# Patient Record
Sex: Male | Born: 1960
Health system: Southern US, Community
[De-identification: ages and names within clinical notes are randomized; demographics above are authoritative.]

## PROBLEM LIST (undated history)

## (undated) DIAGNOSIS — E119 Type 2 diabetes mellitus without complications: Secondary | ICD-10-CM

## (undated) DIAGNOSIS — E785 Hyperlipidemia, unspecified: Secondary | ICD-10-CM

## (undated) DIAGNOSIS — I10 Essential (primary) hypertension: Secondary | ICD-10-CM

## (undated) DIAGNOSIS — M1990 Unspecified osteoarthritis, unspecified site: Secondary | ICD-10-CM

## (undated) HISTORY — DX: Essential (primary) hypertension: I10

## (undated) HISTORY — DX: Hyperlipidemia, unspecified: E78.5

## (undated) HISTORY — DX: Unspecified osteoarthritis, unspecified site: M19.90

---

## 1999-08-16 ENCOUNTER — Encounter: Payer: Self-pay | Admitting: Emergency Medicine

## 1999-08-16 ENCOUNTER — Emergency Department (HOSPITAL_COMMUNITY): Admission: EM | Admit: 1999-08-16 | Discharge: 1999-08-16 | Payer: Self-pay | Admitting: Emergency Medicine

## 2000-10-27 ENCOUNTER — Encounter: Payer: Self-pay | Admitting: Emergency Medicine

## 2000-10-27 ENCOUNTER — Emergency Department (HOSPITAL_COMMUNITY): Admission: EM | Admit: 2000-10-27 | Discharge: 2000-10-27 | Payer: Self-pay | Admitting: Emergency Medicine

## 2004-07-14 ENCOUNTER — Emergency Department (HOSPITAL_COMMUNITY): Admission: EM | Admit: 2004-07-14 | Discharge: 2004-07-14 | Payer: Self-pay | Admitting: Emergency Medicine

## 2009-08-28 ENCOUNTER — Emergency Department (HOSPITAL_COMMUNITY): Admission: EM | Admit: 2009-08-28 | Discharge: 2009-08-28 | Payer: Self-pay | Admitting: Family Medicine

## 2019-01-13 ENCOUNTER — Other Ambulatory Visit: Payer: Self-pay

## 2019-01-13 ENCOUNTER — Encounter (HOSPITAL_COMMUNITY): Payer: Self-pay

## 2019-01-13 ENCOUNTER — Ambulatory Visit (HOSPITAL_COMMUNITY)
Admission: EM | Admit: 2019-01-13 | Discharge: 2019-01-13 | Disposition: A | Discharge status: Home / Self Care | Payer: Self-pay

## 2019-01-13 DIAGNOSIS — I10 Essential (primary) hypertension: Secondary | ICD-10-CM

## 2019-01-13 DIAGNOSIS — G8929 Other chronic pain: Secondary | ICD-10-CM

## 2019-01-13 DIAGNOSIS — M25552 Pain in left hip: Secondary | ICD-10-CM

## 2019-01-13 DIAGNOSIS — M25532 Pain in left wrist: Secondary | ICD-10-CM

## 2019-01-13 DIAGNOSIS — M25562 Pain in left knee: Secondary | ICD-10-CM

## 2019-01-13 HISTORY — DX: Type 2 diabetes mellitus without complications: E11.9

## 2019-01-13 MED ORDER — NAPROXEN 500 MG PO TABS: 500.0000 mg | ORAL_TABLET | Freq: Two times a day (BID) | ORAL | 0 refills | Status: DC

## 2019-01-13 NOTE — Discharge Instructions (Addendum)
Take the naproxen as needed for your joint pain.  Follow-up with an orthopedist as needed for this ongoing chronic discomfort.    Your blood pressure is elevated today at 165/90.  Please have this rechecked by your primary care provider in 2 weeks.  If you do not have a primary care provider, one is suggested below.

## 2019-01-13 NOTE — ED Triage Notes (Addendum)
Patient reports having right wrist pain, hip pain and numbness and tingling sensation on left leg for 1 year, is worse when hi walks or stand up for long periods of  time.

## 2019-01-13 NOTE — ED Provider Notes (Signed)
[facility]    CSN: [id number] Arrival date & time: 01/13/19  1125      History   Chief Complaint Chief Complaint  Patient presents with  . Hip Pain  . Leg Pain  . Wrist Pain    HPI Carlos Charles is a 58 y.o. male.   Patient presents with pain in his left wrist, left hip, and left knee for >1 year.  He denies falls or injury.  He reports intermittent numbness and weakness in his left leg.  He denies saddle anesthesia, loss of bowel/bladder control.  His pain is worse with walking and standing and improves with rest.  He states he was treated with Percocet and then Neurontin in the past.  He has attempted treatment recently with Tylenol, ibuprofen, and Aleve without relief.  He also states he was on blood pressure medication but has stopped taking those in the last few months.  He states he does not have a PCP.  The history is provided by the patient.    Past Medical History:  Diagnosis Date  . Diabetes mellitus without complication (HCC)     There are no active problems to display for this patient.   History reviewed. No pertinent surgical history.     Home Medications    Prior to Admission medications   Medication Sig Start Date End Date Taking? Authorizing Provider  metFORMIN (GLUCOPHAGE) 850 MG tablet Take 850 mg by mouth 2 (two) times daily with a meal.   Yes [provider]  naproxen (NAPROSYN) 500 MG tablet Take 1 tablet (500 mg total) by mouth 2 (two) times daily. 01/13/19   Mickie Bailate, Hadas Jessop H, NP    Family History Family History  Problem Relation Age of Onset  . Diabetes Mother   . Healthy Father   . Diabetes Sister     Social History Social History   Tobacco Use  . Smoking status: Current Every Day Smoker  . Smokeless tobacco: Never Used  Substance Use Topics  . Alcohol use: Yes  . Drug use: Not on file     Allergies   Patient has no allergy information on record.   Review of Systems Review of Systems  Constitutional:  Negative for chills and fever.  HENT: Negative for ear pain and sore throat.   Eyes: Negative for pain and visual disturbance.  Respiratory: Negative for cough and shortness of breath.   Cardiovascular: Negative for chest pain and palpitations.  Gastrointestinal: Negative for abdominal pain and vomiting.  Genitourinary: Negative for dysuria and hematuria.  Musculoskeletal: Positive for arthralgias. Negative for back pain.  Skin: Negative for color change and rash.  Neurological: Positive for weakness. Negative for seizures, syncope and numbness.  All other systems reviewed and are negative.    Physical Exam Triage Vital Signs ED Triage Vitals  Enc Vitals Group     BP 01/13/19 1149 (!) 165/90     Pulse Rate 01/13/19 1149 100     Resp 01/13/19 1149 18     Temp 01/13/19 1149 98.4 F (36.9 C)     Temp Source 01/13/19 1149 Oral     SpO2 01/13/19 1149 97 %     Weight --      Height --      Head Circumference --      Peak Flow --      Pain Score 01/13/19 1143 10     Pain Loc --      Pain Edu? --  Excl. in GC? --    No data found.  Updated Vital Signs BP (!) 165/90 (BP Location: Right Arm)   Pulse 100   Temp 98.4 F (36.9 C) (Oral)   Resp 18   SpO2 97%   Visual Acuity Right Eye Distance:   Left Eye Distance:   Bilateral Distance:    Right Eye Near:   Left Eye Near:    Bilateral Near:     Physical Exam Vitals signs and nursing note reviewed.  Constitutional:      Appearance: He is well-developed.  HENT:     Head: Normocephalic and atraumatic.     Mouth/Throat:     Mouth: Mucous membranes are moist.  Eyes:     Conjunctiva/sclera: Conjunctivae normal.  Neck:     Musculoskeletal: Neck supple.  Cardiovascular:     Rate and Rhythm: Normal rate and regular rhythm.     Heart sounds: No murmur.  Pulmonary:     Effort: Pulmonary effort is normal. No respiratory distress.     Breath sounds: Normal breath sounds.  Abdominal:     Palpations: Abdomen is soft.      Tenderness: There is no abdominal tenderness.  Musculoskeletal: Normal range of motion.        General: Tenderness present. No swelling, deformity or signs of injury.     Right lower leg: No edema.     Left lower leg: No edema.     Comments: Generalized tenderness to palpation of left wrist, left knee, and left hip.   Skin:    General: Skin is warm and dry.     Capillary Refill: Capillary refill takes less than 2 seconds.     Findings: No bruising, erythema, lesion or rash.  Neurological:     General: No focal deficit present.     Mental Status: He is alert and oriented to person, place, and time.     Sensory: No sensory deficit.     Motor: No weakness.     Coordination: Coordination normal.     Gait: Gait normal.     Deep Tendon Reflexes: Reflexes normal.      UC Treatments / Results  Labs (all labs ordered are listed, but only abnormal results are displayed) Labs Reviewed - No data to display  EKG   Radiology No results found.  Procedures Procedures (including critical care time)  Medications Ordered in UC Medications - No data to display  Initial Impression / Assessment and Plan / UC Course  I have reviewed the triage vital signs and the nursing notes.  Pertinent labs & imaging results that were available during my care of the patient were reviewed by me and considered in my medical decision making (see chart for details).    Left wrist pain, left hip pain, left knee pain, elevated blood pressure with known diagnosis of hypertension.  Treating ongoing chronic joint discomfort with naproxen.  Discussed with patient that he can follow-up with orthopedics for this ongoing chronic joint pain.  Discussed with patient that his blood pressure is elevated and he needs to have this rechecked by his PCP or the PCP suggested in the next 2 to 4 weeks.  Patient agrees to plan of care.     Final Clinical Impressions(s) / UC Diagnoses   Final diagnoses:  Left wrist pain  Pain  of left hip joint  Chronic pain of left knee  Elevated blood pressure reading in office with diagnosis of hypertension     Discharge Instructions  Take the naproxen as needed for your joint pain.  Follow-up with an orthopedist as needed for this ongoing chronic discomfort.    Your blood pressure is elevated today at 165/90.  Please have this rechecked by your primary care provider in 2 weeks.  If you do not have a primary care provider, one is suggested below.         ED Prescriptions    Medication Sig Dispense Auth. Provider   naproxen (NAPROSYN) 500 MG tablet Take 1 tablet (500 mg total) by mouth 2 (two) times daily. 30 tablet Mickie Bail, NP     I have reviewed the PDMP during this encounter.   Mickie Bail, NP 01/13/19 1235

## 2019-03-01 ENCOUNTER — Other Ambulatory Visit: Payer: Self-pay

## 2019-03-01 ENCOUNTER — Encounter (HOSPITAL_COMMUNITY): Payer: Self-pay

## 2019-03-01 ENCOUNTER — Ambulatory Visit (HOSPITAL_COMMUNITY)
Admission: EM | Admit: 2019-03-01 | Discharge: 2019-03-01 | Disposition: A | Discharge status: Home / Self Care | Payer: Self-pay | Attending: Urgent Care | Admitting: Urgent Care

## 2019-03-01 DIAGNOSIS — Z9114 Patient's other noncompliance with medication regimen: Secondary | ICD-10-CM

## 2019-03-01 DIAGNOSIS — Z789 Other specified health status: Secondary | ICD-10-CM

## 2019-03-01 DIAGNOSIS — I10 Essential (primary) hypertension: Secondary | ICD-10-CM | POA: Insufficient documentation

## 2019-03-01 DIAGNOSIS — F17200 Nicotine dependence, unspecified, uncomplicated: Secondary | ICD-10-CM | POA: Insufficient documentation

## 2019-03-01 DIAGNOSIS — M25532 Pain in left wrist: Secondary | ICD-10-CM | POA: Insufficient documentation

## 2019-03-01 DIAGNOSIS — Z7289 Other problems related to lifestyle: Secondary | ICD-10-CM | POA: Insufficient documentation

## 2019-03-01 DIAGNOSIS — M25432 Effusion, left wrist: Secondary | ICD-10-CM | POA: Insufficient documentation

## 2019-03-01 DIAGNOSIS — E1165 Type 2 diabetes mellitus with hyperglycemia: Secondary | ICD-10-CM | POA: Insufficient documentation

## 2019-03-01 LAB — POCT URINALYSIS DIP (DEVICE)
Bilirubin Urine: NEGATIVE
Glucose, UA: >=1000 mg/dL — AB
Ketones, ur: 15 mg/dL — AB
Nitrite: NEGATIVE
Protein, ur: 100 mg/dL — AB
Specific Gravity, Urine: >=1.03 (ref 1.005–1.030)
Urobilinogen, UA: 0.2 mg/dL (ref 0.0–1.0)
pH: 5 (ref 5.0–8.0)

## 2019-03-01 LAB — CBC
HCT: 39.8 % (ref 39.0–52.0)
Hemoglobin: 13.4 g/dL (ref 13.0–17.0)
MCH: 31.5 pg (ref 26.0–34.0)
MCHC: 33.7 g/dL (ref 30.0–36.0)
MCV: 93.6 fL (ref 80.0–100.0)
Platelets: 231 10*3/uL (ref 150–400)
RBC: 4.25 MIL/uL (ref 4.22–5.81)
RDW: 12.6 % (ref 11.5–15.5)
WBC: 8 10*3/uL (ref 4.0–10.5)
nRBC: 0 % (ref 0.0–0.2)

## 2019-03-01 LAB — GLUCOSE, CAPILLARY: Glucose-Capillary: 110 mg/dL — ABNORMAL HIGH (ref 70–99)

## 2019-03-01 MED ORDER — LISINOPRIL 10 MG PO TABS: 10.0000 mg | ORAL_TABLET | Freq: Every day | ORAL | 0 refills | Status: DC

## 2019-03-01 MED ORDER — PREDNISONE 20 MG PO TABS: ORAL_TABLET | ORAL | 0 refills | Status: DC

## 2019-03-01 NOTE — ED Provider Notes (Signed)
MRN: [id number] DOB: [birth year]  Subjective:   Carlos Charles is a 58 y.o. male presenting for 1 day history of acute onset of left sided wrist pain, swelling that is worsening and severe.  Has tried [employer] consistently with temporary relief only.  Denies falls, trauma, open wound.  Denies history of gout.  Patient is on Lantus, takes 16 units nightly. Has not been taking this. He also has a hx of HTN, has not taken his bp medication since running out a month ago. Per patient has a hx of left hip arthritis and was told he needs a hip replacement. Currently does not have insurance, no PCP.   Smokes 1/2-1 pack per day, drinks 1/5 of liquor about every weekend.  No current facility-administered medications for this encounter.   Current Outpatient Medications:  .  metFORMIN (GLUCOPHAGE) 850 MG tablet, Take 850 mg by mouth 2 (two) times daily with a meal., Disp: , Rfl:  .  naproxen (NAPROSYN) 500 MG tablet, Take 1 tablet (500 mg total) by mouth 2 (two) times daily., Disp: 30 tablet, Rfl: 0    No Known Allergies   Past Medical History:  Diagnosis Date  . Diabetes mellitus without complication ([place])      Denies psh.    Family History  Problem Relation Age of Onset  . Diabetes Mother   . Healthy Father   . Diabetes Sister     Social History   Tobacco Use  . Smoking status: Current Every Day Smoker  . Smokeless tobacco: Never Used  Substance Use Topics  . Alcohol use: Yes  . Drug use: Not on file    ROS   Objective:   Vitals: BP (!) 186/97 (BP Location: Right Arm)   Pulse (!) 104   Temp 97.9 F (36.6 C) (Temporal)   Resp 16   Wt 173 lb (78.5 kg)   SpO2 99%   BP 174/95, Pulse 93 on recheck by PA-Dawnyel Leven at 13:52, cuff on left arm in seated position.   Physical Exam Constitutional:      General: He is not in acute distress.    Appearance: Normal appearance. He is well-developed. He is not ill-appearing, toxic-appearing or diaphoretic.  HENT:     Head:  Normocephalic and atraumatic.     Right Ear: External ear normal.     Left Ear: External ear normal.     Nose: Nose normal.     Mouth/Throat:     Mouth: Mucous membranes are moist.     Pharynx: Oropharynx is clear.  Eyes:     General: No scleral icterus.    Extraocular Movements: Extraocular movements intact.     Pupils: Pupils are equal, round, and reactive to light.  Cardiovascular:     Rate and Rhythm: Normal rate and regular rhythm.     Heart sounds: Normal heart sounds. No murmur. No friction rub. No gallop.   Pulmonary:     Effort: Pulmonary effort is normal. No respiratory distress.     Breath sounds: Normal breath sounds. No stridor. No wheezing, rhonchi or rales.  Musculoskeletal:     Left wrist: He exhibits decreased range of motion, tenderness (Exquisite tenderness worse over radial aspect even with lightest superficial palpation) and swelling (2+ over radial aspect, tapers off laterally toward ulnar side; there is associated warmth and mild erythema). He exhibits no deformity and no laceration.  Skin:    General: Skin is warm and dry.  Neurological:     Mental Status: He is  alert and oriented to person, place, and time.     Cranial Nerves: No cranial nerve deficit.     Motor: No weakness.     Coordination: Coordination normal.     Gait: Gait normal.     Deep Tendon Reflexes: Reflexes normal.  Psychiatric:        Mood and Affect: Mood normal.        Behavior: Behavior normal.        Thought Content: Thought content normal.        Judgment: Judgment normal.    Results for orders placed or performed during the hospital encounter of 03/01/19 (from the past 24 hour(s))  Glucose, capillary     Status: Abnormal   Collection Time: 03/01/19  2:07 PM  Result Value Ref Range   Glucose-Capillary 110 (H) 70 - 99 mg/dL  POCT urinalysis dip (device)     Status: Abnormal   Collection Time: 03/01/19  2:10 PM  Result Value Ref Range   Glucose, UA >=1000 (A) NEGATIVE mg/dL    Bilirubin Urine NEGATIVE NEGATIVE   Ketones, ur 15 (A) NEGATIVE mg/dL   Specific Gravity, Urine >=1.030 1.005 - 1.030   Hgb urine dipstick TRACE (A) NEGATIVE   pH 5.0 5.0 - 8.0   Protein, ur 100 (A) NEGATIVE mg/dL   Urobilinogen, UA 0.2 0.0 - 1.0 mg/dL   Nitrite NEGATIVE NEGATIVE   Leukocytes,Ua TRACE (A) NEGATIVE    Assessment and Plan :   1. Acute pain of left wrist   2. Pain and swelling of left wrist   3. Essential hypertension   4. Elevated blood pressure reading with diagnosis of hypertension   5. Uncontrolled type 2 diabetes mellitus with hyperglycemia (HCC)   6. Tobacco use disorder   7. Alcohol use     Emphasized need to manage chronic conditions a lot better.  Will use a prednisone course for inflammatory process such as gout.  Patient is to stop drinking alcohol as best he can.  Hydrate much better.  Refill of his blood pressure medication lisinopril.  Recommended lifestyle and dietary modification.  Patient is contact [facility] to get help finding a PCP. Counseled patient on potential for adverse effects with medications prescribed/recommended today, ER and return-to-clinic precautions discussed, patient verbalized understanding.    Wallis Bamberg, PA-C 03/01/19 1443

## 2019-03-01 NOTE — ED Triage Notes (Signed)
Pt states he has pain in his left wrist x 2 days. Pt states it swollen and aching and throbbing. Pt states he wake up to this pain and swelling.

## 2019-03-01 NOTE — Discharge Instructions (Signed)
Please cut back on alcohol drinking as much as you can. You need to hydrate with at least 2 liters of water daily, that's 64 ounces.    For elevated blood pressure, make sure you are monitoring salt in your diet.  Do not eat restaurant foods and limit processed foods at home.  Processed foods include things like frozen meals preseason meats and dinners.  Make sure your pain attention to sodium labels on foods you by at the grocery store.  For seasoning you can use a brand called Mrs[employer] Dash which includes a lot of salt free seasonings.  Salads - kale, spinach, cabbage, spring mix; use seeds like pumpkin seeds or sunflower seeds, almonds; you can also use 1-2 hard boiled eggs in your salads Fruits - avocadoes, berries (blueberries, raspberries, blackberries), apples, oranges Vegetables - aspargus, cauliflower, broccoli, green beans, brussel spouts, bell peppers; stay away from starchy vegetables like potatoes, carrots, peas  Regarding meat it is better to eat lean meats and limit your red meat consumption including pork.  Wild caught fish, chicken breast are good options.  Do not eat any foods on this list that you are allergic to.

## 2019-03-09 ENCOUNTER — Inpatient Hospital Stay (HOSPITAL_COMMUNITY)
Admission: EM | Admit: 2019-03-09 | Discharge: 2019-03-13 | DRG: 982 | Disposition: A | Discharge status: Home / Self Care | Payer: Self-pay | Attending: Internal Medicine | Admitting: Internal Medicine

## 2019-03-09 ENCOUNTER — Encounter: Payer: Self-pay | Admitting: Internal Medicine

## 2019-03-09 ENCOUNTER — Encounter (HOSPITAL_COMMUNITY): Payer: Self-pay | Admitting: *Deleted

## 2019-03-09 ENCOUNTER — Encounter (HOSPITAL_COMMUNITY)
Admission: EM | Disposition: A | Discharge status: Home / Self Care | Payer: Self-pay | Source: Home / Self Care | Attending: Internal Medicine

## 2019-03-09 ENCOUNTER — Other Ambulatory Visit: Payer: Self-pay

## 2019-03-09 ENCOUNTER — Emergency Department (HOSPITAL_COMMUNITY): Payer: Self-pay

## 2019-03-09 ENCOUNTER — Inpatient Hospital Stay (HOSPITAL_COMMUNITY): Payer: Self-pay | Admitting: Certified Registered"

## 2019-03-09 ENCOUNTER — Encounter (HOSPITAL_COMMUNITY): Payer: Self-pay | Admitting: Emergency Medicine

## 2019-03-09 ENCOUNTER — Ambulatory Visit: Payer: Self-pay | Admitting: Internal Medicine

## 2019-03-09 VITALS — BP 165/101 | HR 83 | Temp 98.1°F | Ht 71.0 in | Wt 173.0 lb

## 2019-03-09 DIAGNOSIS — I10 Essential (primary) hypertension: Secondary | ICD-10-CM | POA: Diagnosis present

## 2019-03-09 DIAGNOSIS — Z91120 Patient's intentional underdosing of medication regimen due to financial hardship: Secondary | ICD-10-CM

## 2019-03-09 DIAGNOSIS — L03114 Cellulitis of left upper limb: Secondary | ICD-10-CM | POA: Diagnosis present

## 2019-03-09 DIAGNOSIS — Z7984 Long term (current) use of oral hypoglycemic drugs: Secondary | ICD-10-CM

## 2019-03-09 DIAGNOSIS — Z79899 Other long term (current) drug therapy: Secondary | ICD-10-CM

## 2019-03-09 DIAGNOSIS — E119 Type 2 diabetes mellitus without complications: Secondary | ICD-10-CM

## 2019-03-09 DIAGNOSIS — Z794 Long term (current) use of insulin: Secondary | ICD-10-CM

## 2019-03-09 DIAGNOSIS — E118 Type 2 diabetes mellitus with unspecified complications: Secondary | ICD-10-CM

## 2019-03-09 DIAGNOSIS — M65142 Other infective (teno)synovitis, left hand: Secondary | ICD-10-CM | POA: Diagnosis present

## 2019-03-09 DIAGNOSIS — T383X6A Underdosing of insulin and oral hypoglycemic [antidiabetic] drugs, initial encounter: Secondary | ICD-10-CM | POA: Diagnosis present

## 2019-03-09 DIAGNOSIS — E785 Hyperlipidemia, unspecified: Secondary | ICD-10-CM | POA: Diagnosis present

## 2019-03-09 DIAGNOSIS — Z20828 Contact with and (suspected) exposure to other viral communicable diseases: Secondary | ICD-10-CM | POA: Diagnosis present

## 2019-03-09 DIAGNOSIS — E1165 Type 2 diabetes mellitus with hyperglycemia: Secondary | ICD-10-CM | POA: Diagnosis present

## 2019-03-09 DIAGNOSIS — M160 Bilateral primary osteoarthritis of hip: Secondary | ICD-10-CM | POA: Diagnosis present

## 2019-03-09 DIAGNOSIS — F17210 Nicotine dependence, cigarettes, uncomplicated: Secondary | ICD-10-CM

## 2019-03-09 DIAGNOSIS — Z833 Family history of diabetes mellitus: Secondary | ICD-10-CM

## 2019-03-09 DIAGNOSIS — M7989 Other specified soft tissue disorders: Secondary | ICD-10-CM

## 2019-03-09 DIAGNOSIS — M170 Bilateral primary osteoarthritis of knee: Secondary | ICD-10-CM | POA: Diagnosis present

## 2019-03-09 DIAGNOSIS — Z7689 Persons encountering health services in other specified circumstances: Secondary | ICD-10-CM

## 2019-03-09 DIAGNOSIS — L02512 Cutaneous abscess of left hand: Principal | ICD-10-CM | POA: Diagnosis present

## 2019-03-09 DIAGNOSIS — M659 Synovitis and tenosynovitis, unspecified: Secondary | ICD-10-CM | POA: Diagnosis present

## 2019-03-09 DIAGNOSIS — M65942 Unspecified synovitis and tenosynovitis, left hand: Secondary | ICD-10-CM | POA: Diagnosis present

## 2019-03-09 HISTORY — PX: I & D EXTREMITY: SHX5045

## 2019-03-09 LAB — BASIC METABOLIC PANEL
Anion gap: 11 (ref 5–15)
BUN: 10 mg/dL (ref 6–20)
CO2: 24 mmol/L (ref 22–32)
Calcium: 9.1 mg/dL (ref 8.9–10.3)
Chloride: 101 mmol/L (ref 98–111)
Creatinine, Ser: 0.7 mg/dL (ref 0.61–1.24)
GFR calc Af Amer: >60 mL/min (ref >60)
GFR calc non Af Amer: >60 mL/min (ref >60)
Glucose, Bld: 199 mg/dL — ABNORMAL HIGH (ref 70–99)
Potassium: 4 mmol/L (ref 3.5–5.1)
Sodium: 136 mmol/L (ref 135–145)

## 2019-03-09 LAB — SEDIMENTATION RATE: Sed Rate: 77 mm/h — ABNORMAL HIGH (ref 0–16)

## 2019-03-09 LAB — CBC WITH DIFFERENTIAL/PLATELET
Abs Immature Granulocytes: 0.03 10*3/uL (ref 0.00–0.07)
Basophils Absolute: 0 10*3/uL (ref 0.0–0.1)
Basophils Relative: 0 %
Eosinophils Absolute: 0.1 10*3/uL (ref 0.0–0.5)
Eosinophils Relative: 1 %
HCT: 41.6 % (ref 39.0–52.0)
Hemoglobin: 13.6 g/dL (ref 13.0–17.0)
Immature Granulocytes: 0 %
Lymphocytes Relative: 26 %
Lymphs Abs: 2.1 10*3/uL (ref 0.7–4.0)
MCH: 31.9 pg (ref 26.0–34.0)
MCHC: 32.7 g/dL (ref 30.0–36.0)
MCV: 97.4 fL (ref 80.0–100.0)
Monocytes Absolute: 0.8 10*3/uL (ref 0.1–1.0)
Monocytes Relative: 10 %
Neutro Abs: 5.1 10*3/uL (ref 1.7–7.7)
Neutrophils Relative %: 63 %
Platelets: 315 10*3/uL (ref 150–400)
RBC: 4.27 MIL/uL (ref 4.22–5.81)
RDW: 12.7 % (ref 11.5–15.5)
WBC: 8.2 10*3/uL (ref 4.0–10.5)
nRBC: 0 % (ref 0.0–0.2)

## 2019-03-09 LAB — C-REACTIVE PROTEIN: CRP: 8.3 mg/dL — ABNORMAL HIGH (ref <1.0)

## 2019-03-09 LAB — SARS CORONAVIRUS 2 BY RT PCR (HOSPITAL ORDER, PERFORMED IN ~~LOC~~ HOSPITAL LAB): SARS Coronavirus 2: NEGATIVE

## 2019-03-09 LAB — HEMOGLOBIN A1C
Hgb A1c MFr Bld: 9 % — ABNORMAL HIGH (ref 4.8–5.6)
Mean Plasma Glucose: 211.6 mg/dL

## 2019-03-09 LAB — CBG MONITORING, ED: Glucose-Capillary: 171 mg/dL — ABNORMAL HIGH (ref 70–99)

## 2019-03-09 LAB — URIC ACID: Uric Acid, Serum: 3.7 mg/dL (ref 3.7–8.6)

## 2019-03-09 LAB — GLUCOSE, CAPILLARY: Glucose-Capillary: 112 mg/dL — ABNORMAL HIGH (ref 70–99)

## 2019-03-09 SURGERY — IRRIGATION AND DEBRIDEMENT EXTREMITY
Anesthesia: General | Site: Hand | Laterality: Left

## 2019-03-09 MED ORDER — DEXAMETHASONE SODIUM PHOSPHATE 4 MG/ML IJ SOLN
INTRAMUSCULAR | Status: DC | PRN
  Administered 2019-03-09: 5 mg via INTRAVENOUS

## 2019-03-09 MED ORDER — MIDAZOLAM HCL 2 MG/2ML IJ SOLN
INTRAMUSCULAR | Status: AC
  Filled 2019-03-09: qty 2

## 2019-03-09 MED ORDER — SENNOSIDES-DOCUSATE SODIUM 8.6-50 MG PO TABS: 1.0000 | ORAL_TABLET | Freq: Every evening | ORAL | Status: DC | PRN

## 2019-03-09 MED ORDER — FENTANYL CITRATE (PF) 100 MCG/2ML IJ SOLN
INTRAMUSCULAR | Status: AC
  Filled 2019-03-09: qty 2

## 2019-03-09 MED ORDER — LOSARTAN POTASSIUM 25 MG PO TABS: 25.0000 mg | ORAL_TABLET | Freq: Every day | ORAL | 1 refills | Status: DC

## 2019-03-09 MED ORDER — ROCURONIUM BROMIDE 10 MG/ML (PF) SYRINGE
PREFILLED_SYRINGE | INTRAVENOUS | Status: AC
  Filled 2019-03-09: qty 10

## 2019-03-09 MED ORDER — FENTANYL CITRATE (PF) 100 MCG/2ML IJ SOLN
50.0000 ug | Freq: Once | INTRAMUSCULAR | Status: AC
  Administered 2019-03-09: 16:00:00 50 ug via INTRAVENOUS
  Filled 2019-03-09: qty 2

## 2019-03-09 MED ORDER — FENTANYL CITRATE (PF) 250 MCG/5ML IJ SOLN
INTRAMUSCULAR | Status: AC
  Filled 2019-03-09: qty 5

## 2019-03-09 MED ORDER — IOHEXOL 300 MG/ML  SOLN
100.0000 mL | Freq: Once | INTRAMUSCULAR | Status: AC | PRN
  Administered 2019-03-09: 100 mL via INTRAVENOUS

## 2019-03-09 MED ORDER — FENTANYL CITRATE (PF) 100 MCG/2ML IJ SOLN
25.0000 ug | INTRAMUSCULAR | Status: DC | PRN
  Administered 2019-03-09: 25 ug via INTRAVENOUS
  Administered 2019-03-09: 50 ug via INTRAVENOUS
  Administered 2019-03-09: 25 ug via INTRAVENOUS
  Administered 2019-03-09: 50 ug via INTRAVENOUS

## 2019-03-09 MED ORDER — BUPIVACAINE HCL (PF) 0.25 % IJ SOLN
INTRAMUSCULAR | Status: AC
  Filled 2019-03-09: qty 30

## 2019-03-09 MED ORDER — CEFAZOLIN SODIUM-DEXTROSE 2-3 GM-%(50ML) IV SOLR
INTRAVENOUS | Status: DC | PRN
  Administered 2019-03-09: 2 g via INTRAVENOUS

## 2019-03-09 MED ORDER — KETOROLAC TROMETHAMINE 30 MG/ML IJ SOLN
30.0000 mg | Freq: Once | INTRAMUSCULAR | Status: AC
  Administered 2019-03-09: 10:00:00 30 mg via INTRAMUSCULAR

## 2019-03-09 MED ORDER — FENTANYL CITRATE (PF) 100 MCG/2ML IJ SOLN
INTRAMUSCULAR | Status: DC | PRN
  Administered 2019-03-09: 50 ug via INTRAVENOUS

## 2019-03-09 MED ORDER — PROPOFOL 10 MG/ML IV BOLUS
INTRAVENOUS | Status: AC
  Filled 2019-03-09: qty 20

## 2019-03-09 MED ORDER — INSULIN ASPART 100 UNIT/ML ~~LOC~~ SOLN
0.0000 [IU] | Freq: Every day | SUBCUTANEOUS | Status: DC
  Administered 2019-03-10: 3 [IU] via SUBCUTANEOUS
  Administered 2019-03-11: 4 [IU] via SUBCUTANEOUS
  Administered 2019-03-12: 2 [IU] via SUBCUTANEOUS

## 2019-03-09 MED ORDER — CHLORHEXIDINE GLUCONATE 4 % EX LIQD: 60.0000 mL | Freq: Once | CUTANEOUS | Status: DC

## 2019-03-09 MED ORDER — ENOXAPARIN SODIUM 40 MG/0.4ML ~~LOC~~ SOLN
40.0000 mg | SUBCUTANEOUS | Status: DC
  Administered 2019-03-10 – 2019-03-13 (×4): 40 mg via SUBCUTANEOUS
  Filled 2019-03-09 (×4): qty 0.4

## 2019-03-09 MED ORDER — LISINOPRIL 10 MG PO TABS
10.0000 mg | ORAL_TABLET | Freq: Every day | ORAL | Status: DC
  Administered 2019-03-10 – 2019-03-13 (×4): 10 mg via ORAL
  Filled 2019-03-09 (×4): qty 1

## 2019-03-09 MED ORDER — INSULIN GLARGINE 100 UNIT/ML ~~LOC~~ SOLN
8.0000 [IU] | Freq: Every day | SUBCUTANEOUS | Status: DC
  Filled 2019-03-09 (×2): qty 0.08

## 2019-03-09 MED ORDER — POVIDONE-IODINE 10 % EX SWAB: 2.0000 "application " | Freq: Once | CUTANEOUS | Status: DC

## 2019-03-09 MED ORDER — SODIUM CHLORIDE 0.9 % IR SOLN
Status: DC | PRN
  Administered 2019-03-09: 1000 mL

## 2019-03-09 MED ORDER — SODIUM CHLORIDE 0.9 % IR SOLN
Status: DC | PRN
  Administered 2019-03-09: 3000 mL

## 2019-03-09 MED ORDER — LIDOCAINE 2% (20 MG/ML) 5 ML SYRINGE
INTRAMUSCULAR | Status: AC
  Filled 2019-03-09: qty 5

## 2019-03-09 MED ORDER — SUCCINYLCHOLINE CHLORIDE 200 MG/10ML IV SOSY
PREFILLED_SYRINGE | INTRAVENOUS | Status: AC
  Filled 2019-03-09: qty 10

## 2019-03-09 MED ORDER — INSULIN ASPART 100 UNIT/ML ~~LOC~~ SOLN
0.0000 [IU] | Freq: Three times a day (TID) | SUBCUTANEOUS | Status: DC
  Administered 2019-03-09: 3 [IU] via SUBCUTANEOUS
  Administered 2019-03-10: 11 [IU] via SUBCUTANEOUS
  Administered 2019-03-10 – 2019-03-11 (×3): 5 [IU] via SUBCUTANEOUS
  Administered 2019-03-11: 3 [IU] via SUBCUTANEOUS
  Administered 2019-03-12: 1 [IU] via SUBCUTANEOUS
  Administered 2019-03-13: 3 [IU] via SUBCUTANEOUS

## 2019-03-09 MED ORDER — PROPOFOL 10 MG/ML IV BOLUS
INTRAVENOUS | Status: DC | PRN
  Administered 2019-03-09: 200 mg via INTRAVENOUS

## 2019-03-09 MED ORDER — PROMETHAZINE HCL 25 MG/ML IJ SOLN: 6.2500 mg | INTRAMUSCULAR | Status: DC | PRN

## 2019-03-09 MED ORDER — MIDAZOLAM HCL 5 MG/5ML IJ SOLN
INTRAMUSCULAR | Status: DC | PRN
  Administered 2019-03-09: 2 mg via INTRAVENOUS

## 2019-03-09 MED ORDER — ONDANSETRON HCL 4 MG/2ML IJ SOLN
INTRAMUSCULAR | Status: DC | PRN
  Administered 2019-03-09: 4 mg via INTRAVENOUS

## 2019-03-09 MED ORDER — LACTATED RINGERS IV SOLN
INTRAVENOUS | Status: DC
  Administered 2019-03-09 (×2): via INTRAVENOUS

## 2019-03-09 MED ORDER — LIDOCAINE HCL (CARDIAC) PF 100 MG/5ML IV SOSY
PREFILLED_SYRINGE | INTRAVENOUS | Status: DC | PRN
  Administered 2019-03-09: 60 mg via INTRAVENOUS

## 2019-03-09 MED FILL — LOSARTAN POTASSIUM 25 MG TA: 25 | 30 days supply | Qty: 30 | Fill #0

## 2019-03-09 SURGICAL SUPPLY — 48 items
BNDG CONFORM 2 STRL LF (GAUZE/BANDAGES/DRESSINGS) IMPLANT
BNDG ELASTIC 4X5.8 VLCR STR LF (GAUZE/BANDAGES/DRESSINGS) ×3 IMPLANT
BNDG ESMARK 4X9 LF (GAUZE/BANDAGES/DRESSINGS) ×3 IMPLANT
BNDG GAUZE ELAST 4 BULKY (GAUZE/BANDAGES/DRESSINGS) ×3 IMPLANT
CORD BIPOLAR FORCEPS 12FT (ELECTRODE) ×3 IMPLANT
COVER SURGICAL LIGHT HANDLE (MISCELLANEOUS) ×3 IMPLANT
COVER WAND RF STERILE (DRAPES) ×3 IMPLANT
CUFF TOURN SGL QUICK 18X4 (TOURNIQUET CUFF) ×3 IMPLANT
CUFF TOURN SGL QUICK 24 (TOURNIQUET CUFF)
CUFF TRNQT CYL 24X4X16.5-23 (TOURNIQUET CUFF) IMPLANT
DRAIN PENROSE 1/4X12 LTX STRL (WOUND CARE) ×3 IMPLANT
DRSG ADAPTIC 3X8 NADH LF (GAUZE/BANDAGES/DRESSINGS) IMPLANT
GAUZE SPONGE 4X4 12PLY STRL (GAUZE/BANDAGES/DRESSINGS) ×6 IMPLANT
GAUZE SPONGE 4X4 12PLY STRL LF (GAUZE/BANDAGES/DRESSINGS) ×3 IMPLANT
GAUZE SPONGE 4X4 16PLY XRAY LF (GAUZE/BANDAGES/DRESSINGS) ×3 IMPLANT
GAUZE XEROFORM 1X8 LF (GAUZE/BANDAGES/DRESSINGS) IMPLANT
GAUZE XEROFORM 5X9 LF (GAUZE/BANDAGES/DRESSINGS) ×3 IMPLANT
GLOVE BIO SURGEON STRL SZ7.5 (GLOVE) ×3 IMPLANT
GLOVE BIOGEL PI IND STRL 8 (GLOVE) ×1 IMPLANT
GLOVE BIOGEL PI INDICATOR 8 (GLOVE) ×2
GOWN STRL REUS W/ TWL LRG LVL3 (GOWN DISPOSABLE) IMPLANT
GOWN STRL REUS W/ TWL XL LVL3 (GOWN DISPOSABLE) ×2 IMPLANT
GOWN STRL REUS W/TWL LRG LVL3 (GOWN DISPOSABLE)
GOWN STRL REUS W/TWL XL LVL3 (GOWN DISPOSABLE) ×4
KIT BASIN OR (CUSTOM PROCEDURE TRAY) ×3 IMPLANT
KIT TURNOVER KIT B (KITS) ×3 IMPLANT
MANIFOLD NEPTUNE II (INSTRUMENTS) ×3 IMPLANT
NEEDLE HYPO 25GX1X1/2 BEV (NEEDLE) IMPLANT
NS IRRIG 1000ML POUR BTL (IV SOLUTION) ×3 IMPLANT
PACK ORTHO EXTREMITY (CUSTOM PROCEDURE TRAY) ×3 IMPLANT
PAD ARMBOARD 7.5X6 YLW CONV (MISCELLANEOUS) ×3 IMPLANT
PAD CAST 4YDX4 CTTN HI CHSV (CAST SUPPLIES) ×1 IMPLANT
PADDING CAST COTTON 4X4 STRL (CAST SUPPLIES) ×2
SET CYSTO W/LG BORE CLAMP LF (SET/KITS/TRAYS/PACK) IMPLANT
SOL PREP POV-IOD 4OZ 10% (MISCELLANEOUS) ×6 IMPLANT
SPLINT FIBERGLASS 3X12 (CAST SUPPLIES) ×3 IMPLANT
SPONGE LAP 4X18 RFD (DISPOSABLE) IMPLANT
SUT PROLENE 4 0 PS 2 18 (SUTURE) ×9 IMPLANT
SWAB COLLECTION DEVICE MRSA (MISCELLANEOUS) ×6 IMPLANT
SWAB CULTURE ESWAB REG 1ML (MISCELLANEOUS) ×6 IMPLANT
SYR CONTROL 10ML LL (SYRINGE) IMPLANT
TOWEL GREEN STERILE (TOWEL DISPOSABLE) ×3 IMPLANT
TOWEL GREEN STERILE FF (TOWEL DISPOSABLE) ×3 IMPLANT
TUBE CONNECTING 12'X1/4 (SUCTIONS) ×1
TUBE CONNECTING 12X1/4 (SUCTIONS) ×2 IMPLANT
UNDERPAD 30X30 (UNDERPADS AND DIAPERS) ×3 IMPLANT
WATER STERILE IRR 1000ML POUR (IV SOLUTION) ×3 IMPLANT
YANKAUER SUCT BULB TIP NO VENT (SUCTIONS) IMPLANT

## 2019-03-09 NOTE — Transfer of Care (Signed)
Immediate Anesthesia Transfer of Care Note  Patient: Carlos Charles  Procedure(s) Performed: IRRIGATION AND DEBRIDEMENT Left Hand (Left Hand)  Patient Location: PACU  Anesthesia Type:General  Level of Consciousness: awake, alert , oriented and patient cooperative  Airway & Oxygen Therapy: Patient Spontanous Breathing and Patient connected to nasal cannula oxygen  Post-op Assessment: Report given to RN and Post -op Vital signs reviewed and stable  Post vital signs: Reviewed and stable  Last Vitals:  Vitals Value Taken Time  BP 159/82 03/09/19 2119  Temp    Pulse 83 03/09/19 2121  Resp 14 03/09/19 2120  SpO2 99 % 03/09/19 2121  Vitals shown include unvalidated device data.  Last Pain:  Vitals:   03/09/19 1031  TempSrc:   PainSc: 10-Worst pain ever      Patients Stated Pain Goal: 0 (76/28/31 5176)  Complications: No apparent anesthesia complications

## 2019-03-09 NOTE — Assessment & Plan Note (Addendum)
Uncontrolled. On lisinopril 10 mg QD which he did not take this AM. Will switch to an ARB given possible gout of L hand (see L hand swelling A&P).  - STOP lisinopril - Losartan 25 mg QD, IM program  - Follow up in 1 week

## 2019-03-09 NOTE — Op Note (Signed)
PREOPERATIVE DIAGNOSIS: Left hand infection  POSTOPERATIVE DIAGNOSIS: Same  ATTENDING PHYSICIAN: Maudry Mayhew. Jeannie Fend, III, MD who was present and scrubbed for the entire case   ASSISTANT SURGEON: None.   ANESTHESIA: General  SURGICAL PROCEDURES: 1.  Irrigation debridement of left hand dorsal abscess 2.  Left radiocarpal joint arthrotomy with irrigation and debridement 3.  Left carpal tunnel release with irrigation and debridement 4.  Left radical flexor tenosynovectomy  SURGICAL INDICATIONS: Patient is a 59 year old male who presented to the ER today.  He was found to have severe swelling to the left hand as well as pain and erythema.  There was concern for infection to the left hand and a subsequent CT scan of the hand with contrast was obtained.  This showed both dorsal and palmar rim-enhancing fluid collections with the palmar being along the flexor tendons to the fourth digit and within the carpal tunnel.  After discussing treatment options with the patient he did agree to proceed with irrigation debridement of the hand and surgeries as indicated.  FINDING: There was a large dorsal abscess with abundant purulent material.  This was cultured for both aerobic and anaerobic material.  The radiocarpal joint was also opened and was found to have purulent discharge as well.  A volar incision was also made and there was found to be purulent material within the carpal tunnel.  All these areas were copiously irrigated and debridement and a flexor tenosynovectomy was performed  DESCRIPTION OF PROCEDURE: The patient was identified in the preoperative holding area where the risk benefits and alternatives of the procedure were discussed with the patient.  These include but are not limited to infection, bleeding, damage to surrounding structures including blood vessels and nerves, pain, stiffness, recurrence and need for additional procedures.  Informed consent was obtained at that time.  The patient's left  hand was marked with a surgical marking pen.  He was then brought back to the operative suite where a timeout was performed identifying the correct patient operative site.  He was positioned supine on the operative table with his hand outstretched on a hand table.  A tourniquet was placed on the upper arm.  The patient was induced under general anesthesia.  The left upper extremity was then prepped and draped in usual sterile fashion.  The limb was exsanguinated by gravity and the tourniquet was inflated.  We began with the dorsal hand.  A longitudinal incision was made over the dorsal hand in line with the third digit.  Blunt dissection was carried down through the subcutaneous tissues.  About the flexor tendons there was a blush of abundant purulent material.  This was cultured for both aerobic and anaerobic bacteria.  Blunt dissection was performed about the extensor tendons and there was further expression of abundant purulent material.  The area was debrided with curette and rondure's until all infectious material was removed.  Further dissection was then performed proximally.  The EPL tendon was identified and partially released from its sheath.  Blunt dissection was then performed working deep down to the radiocarpal joint.  A arthrotomy was performed within the radiocarpal joint and there was once again a blush of purulent material.  An opening in the capsule was then made and the synovium which was visualized in the wound was debrided with a rondure.  Attention was then turned to the volar wrist.  A longitudinal incision was made in the palm in line with the fourth ray.  This then was taken in a [place]  fashion along the fourth ray working distally and angling at palmar flexion creases.  Blunt dissection was carried down through the subcutaneous tissues.  The palmar aponeurosis was identified and split in line with the surgical incision.  The transverse fibers of the transverse carpal ligament were  then identified and carefully released using a 15 blade.  Upon entering in the carpal tunnel there was another blush of purulent material which was again cultured for both aerobic and anaerobic material.  Continued dissection was performed about the the ligament which was released fully.  There was abundant tenosynovium within the carpal tunnel as well.  Working distally the palmar arch was identified and protected.  The flexor tendons were visualized and the distal wound and no further collections of purulent material were identified.  Attention was then turned back to the flexor tendons within the carpal tunnel.  There was abundant tenosynovium which was isolated and resected.  Care was taken to protect the tendons as well as the median nerve.  Radical debridement of the flexor tendons within the carpal tunnel was performed removing all visualized tenosynovium.  At this point both volar and dorsal wounds were copiously irrigated with normal saline.  This was done by cystoscopy tubing and it was 3 L of saline.  The palmar wound was then closed with interrupted 4-0 Prolene sutures.  The dorsal wound was closed over top of a Penrose drain utilizing interrupted 4-0 Prolene sutures as well.  Xeroform, 4 x 4's and a well-padded volar slab splint were then placed.  The tourniquet was released and the patient had return of brisk capillary refill to all of his digits.  He was awoken from his anesthesia, extubated and taken to the PACU in stable condition.  He tolerated the procedure well and there were no complications.  ESTIMATED BLOOD LOSS: 30 mL  TOURNIQUET TIME: 1 hour  SPECIMENS: Aerobic and anaerobic cultures x2  POSTOPERATIVE PLAN: The patient will be admitted to the medicine service for continued inpatient care.  We will follow up with cultures to determine an antibiotic plan going forward.  I would recommend infectious disease consult to determine a antibiotic plan going forward.  We will work with therapy  both inpatient and outpatient to regain his wrist range of motion as well as digit range of motion.  IMPLANTS: None

## 2019-03-09 NOTE — ED Triage Notes (Signed)
Pt arrives to ED from home with complaints of left hand swelling since last Monday. Patient states he started his prednisone but stopped because it made his back hurt.

## 2019-03-09 NOTE — Patient Instructions (Signed)
Patient was seen for left hand swelling that has been going on for about 1 to 2 weeks.  He was seen in urgent care and given treatment for gout which did not alleviate symptoms.  He does not have any warmth or erythema making gout less likely but does have significant swelling and has no range of motion of wrist and very minimal range of motion of digits.    Needs imaging of hand and hand surgery consult.  If you have any questions please call the internal medicine clinic at [phone] and asked to speak with Dr. Frederico Hamman.

## 2019-03-09 NOTE — Anesthesia Procedure Notes (Signed)
Procedure Name: LMA Insertion Date/Time: 03/09/2019 7:53 PM Performed by: Oletta Lamas, CRNA Pre-anesthesia Checklist: Patient identified, Emergency Drugs available, Suction available and Patient being monitored Patient Re-evaluated:Patient Re-evaluated prior to induction Oxygen Delivery Method: Circle System Utilized Preoxygenation: Pre-oxygenation with 100% oxygen Induction Type: IV induction Ventilation: Mask ventilation without difficulty LMA: LMA inserted LMA Size: 4.0 Number of attempts: 1 Placement Confirmation: positive ETCO2 Tube secured with: Tape Dental Injury: Teeth and Oropharynx as per pre-operative assessment

## 2019-03-09 NOTE — Anesthesia Postprocedure Evaluation (Signed)
Anesthesia Post Note  Patient: Carlos Charles  Procedure(s) Performed: IRRIGATION AND DEBRIDEMENT Left Hand (Left Hand)     Patient location during evaluation: PACU Anesthesia Type: General Level of consciousness: awake and alert Pain management: pain level controlled Vital Signs Assessment: post-procedure vital signs reviewed and stable Respiratory status: spontaneous breathing, nonlabored ventilation, respiratory function stable and patient connected to nasal cannula oxygen Cardiovascular status: blood pressure returned to baseline and stable Postop Assessment: no apparent nausea or vomiting Anesthetic complications: no    Last Vitals:  Vitals:   03/09/19 2219 03/09/19 2222  BP: (!) 164/58 (!) 144/59  Pulse: 74 73  Resp: 11 10  Temp:  36.6 C  SpO2: 99% 98%    Last Pain:  Vitals:   03/09/19 2149  TempSrc:   PainSc: 5                  Tiajuana Amass

## 2019-03-09 NOTE — H&P (Addendum)
Date: 03/09/2019               Patient Name:  Carlos Charles MRN: [id number]  DOB: 11-22-1960 Age / Sex: 58 y.o., male   PCP: Patient, No Pcp Per              Medical Service: Internal Medicine Teaching Service              Attending Physician: Dr. Mikey Bussing, Marthenia Rolling, DO    First Contact: Quintella Reichert, MS 3 Pager: [phone]  Second Contact: Dr. Darl Pikes Pager: [phone]  Third Contact Dr. Chesley Mires Pager: [phone]       After Hours (After 5p/  First Contact Pager: [phone]  weekends / holidays): Second Contact Pager: [phone]   Chief Complaint: hand pain and swelling  History of Present Illness: Carlos Charles is a 58 y.o. male with PMHx of type 2 diabetes mellitus and hypertension who presents with left hand pain and swelling.  Patient states he was in his usual state of health until he awoke about a week ago with swelling and pain to the top of his left hand.  This progressively worsened and he also developed redness to the area about 2 nights ago.  Pain is localized mainly to the area of the swelling but occasionally radiates up his forearm.  He denies fevers or chills.  Yesterday he went to UC and was given naproxen and prednisone for possible gout.  He only took about 4mg  of prednisone and then stopped because it caused back pain.  The prednisone may have helped some with the swelling but did not help at all with the pain.  The naproxen did not help.  He has also taken Neurontin and BC powder without relief.  He was seen by his PCP today who gave him a dose of IM Toradol and sent him to the ED due to concern for hand infection.  Pain was 10/10 on arrival to the ED but is now down to 8/10 after receiving fentanyl.  Patient denies prior history of similar symptoms.  No history of gout.  He consumes alcohol on weekends but does not consume red meat often.  No recent injuries or wounds to the hand or any other area.  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  Denies IV drug use.  He has history of type 2 diabetes and has been treated with insulin and metformin.  However he has had trouble affording his Lantus recently.  Currently he is taking only metformin 850mg  BID.  He thinks he was taking 16 units of Lantus daily when he was on insulin.  Most recent A1c was reportedly just under 9% a few months ago.  Meds:  Current Meds  Medication Sig  . lisinopril (ZESTRIL) 10 MG tablet Take 10 mg by mouth daily.  . metFORMIN (GLUCOPHAGE) 850 MG tablet Take 850 mg by mouth 2 (two) times daily with a meal.     Allergies: Allergies as of 03/09/2019  . (No Known Allergies)   Past Medical History:  Diagnosis Date  . Diabetes mellitus without complication (HCC)   . Essential hypertension   . Hyperlipidemia   . Osteoarthritis of L hip     Family History: Diabetes in mother and two sisters.  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  Smokes about 1/3 ppd of cigarettes.  Reports alcohol  use on weekends.  Denies illicit drug use.  Review of Systems: Endorses intermittent mild chronic SOB which he attributes to smoking, no recent changes.  A complete ROS was otherwise negative except as per HPI.   Physical Exam: Blood pressure (!) 155/93, pulse 72, temperature 98.4 F (36.9 C), temperature source Oral, resp. rate 16, SpO2 99 %. General: awake, alert, sitting comfortably on exam table in NAD Pulm: lungs clear to auscultation bilaterally, normal work of breathing on room air CV: regular rate and rhythm, no murmurs, rubs or gallups GI: abdomen soft, nontender, nondistended MSK: no peripheral edema in lower extremities Neuro: A&Ox3; no focal deficits Skin: diffuse edema and erythema to dorsal left hand; fingers flexed Psych: normal mood and affect    Assessment & Plan by Problem: Active Problems:   Tenosynovitis of left hand  Tenosynovitis  of left hand: Hand CT shows fluid around the flexor digitorum and extensor digitorum tendons as well as the flexor carpi ulnaris tendon, concerning for inflammatory or infectious tenosynovitis.  Unlikely to be gout given lack of gout hx and imaging findings inconsistent with this diagnosis.  Unclear etiology, no known wounds, IVDU or other portals of entry.  Does have risk factor of inadequately controlled diabetes.  Afebrile, HDS, no signs of sepsis.  Hold antibiotics until after surgery so that cultures can be collected. - Going to OR with Orthopedic Surgery for I&D later today.  May need multiple trips to OR for drainage. - Start antibiotics after surgery pending cultures from OR - Fentanyl for pain  Type 2 diabetes: Poorly-controlled due to being unable to afford insulin, now only on metformin.  Last A1c just under 9% a few months ago while taking insulin and metformin per pt report. - Recheck A1c - Start Lantus at 8 units qHS (lower than prior stated dose of 16u as he is now somewhat insulin-naive)  Hypertension: Uncontrolled today but he did not take his lisinopril 10mg  QD this morning. PCP switched to losartan this morning due to concern for gout. Given that his imaging findings effectively rule out gout, will resume home lisinopril after done in OR. - Start lisinopril 10mg  QD tomorrow morning   CODE STATUS: FULL Diet: NPO until out of OR, then carb-modified DVT prophylaxis: Lovenox 40mg  subq daily   Dispo: Admit patient to Inpatient with expected length of stay greater than 2 midnights.  Signed: Tonia Ghent, Medical Student 03/09/2019, 5:21 PM  Pager: [phone]

## 2019-03-09 NOTE — ED Provider Notes (Signed)
[facility] EMERGENCY DEPARTMENT Provider Note   CSN: [id number] Arrival date & time: 03/09/19  1015     History   Chief Complaint Chief Complaint  Patient presents with   Hand Swelling    HPI Carlos Charles is a 58 y.o. male with past medical history of type 2 diabetes, hypertension, presenting to the emergency department with 1.5 weeks of gradually worsening swelling and pain to the left hand.  Patient states symptoms began last Monday with swelling to the dorsum of the hand upon awakening in the morning.  He had associated pain as well.  He denies any initial injury, wounds or breaks in the skin.  Symptoms did begin on the dorsum of the hand and has expanded diffusely to the hand.  He is having significant pain with any palpation or movement of the hand.  He cannot localize his pain to a particular joint.  He denies fever or chills.  No known history of gout.  He was seen at urgent care who treated him with prednisone and NSAIDs for possible gout, however symptoms worsened.  He took all but 2 tabs of the prednisone without relief.  He was seen by his PCP today who, administered a dose of IM Toradol and sent him to the ED for further evaluation with concern for hand infection. Denies IVDU.     The history is provided by the patient and medical records.    Past Medical History:  Diagnosis Date   Diabetes mellitus without complication (HCC)    Essential hypertension    Hyperlipidemia    Osteoarthritis of L hip     Patient Active Problem List   Diagnosis Date Noted   Type 2 diabetes mellitus (HCC) 03/09/2019   Hypertension 03/09/2019   Hand swelling, left 03/09/2019   Tenosynovitis of left hand 03/09/2019    History reviewed. No pertinent surgical history.      Home Medications    Prior to Admission medications   Medication Sig Start Date End Date Taking? Authorizing Provider  lisinopril (ZESTRIL) 10 MG tablet Take 10 mg by mouth daily.   Yes  [provider]  metFORMIN (GLUCOPHAGE) 850 MG tablet Take 850 mg by mouth 2 (two) times daily with a meal.   Yes [provider]    Family History Family History  Problem Relation Age of Onset   Diabetes Mother    Healthy Father    Diabetes Sister     Social History Social History   Tobacco Use   Smoking status: Current Every Day Smoker    Packs/day: 0.30   Smokeless tobacco: Never Used   Tobacco comment: 1/3 pack per day   Substance Use Topics   Alcohol use: Yes   Drug use: Not on file     Allergies   Patient has no known allergies.   Review of Systems Review of Systems  All other systems reviewed and are negative.    Physical Exam Updated Vital Signs BP (!) 155/93 (BP Location: Right Arm)    Pulse 72    Temp 98.4 F (36.9 C) (Oral)    Resp 16    SpO2 99%   Physical Exam Vitals signs and nursing note reviewed.  Constitutional:      Appearance: He is well-developed.  HENT:     Head: Normocephalic and atraumatic.  Eyes:     Conjunctiva/sclera: Conjunctivae normal.  Cardiovascular:     Rate and Rhythm: Normal rate and regular rhythm.  Pulmonary:  Effort: Pulmonary effort is normal. No respiratory distress.     Breath sounds: Normal breath sounds.  Musculoskeletal:     Comments: Left hand with significant diffuse swelling presenting, which appears worse over the dorsal aspect, though extends just passed the wrist. No obvious streaking. Mild warmth. Diffuse TTP, slightly worse over the palmar aspect at the 5th MCP joint. Limited PROM of the joints 2/t pain. Strong radial pulse. No wounds or fluctuance.    Neurological:     Mental Status: He is alert.  Psychiatric:        Mood and Affect: Mood normal.        Behavior: Behavior normal.      ED Treatments / Results  Labs (all labs ordered are listed, but only abnormal results are displayed) Labs Reviewed  BASIC METABOLIC PANEL - Abnormal; Notable for the following components:       Result Value   Glucose, Bld 199 (*)    All other components within normal limits  SEDIMENTATION RATE - Abnormal; Notable for the following components:   Sed Rate 77 (*)    All other components within normal limits  C-REACTIVE PROTEIN - Abnormal; Notable for the following components:   CRP 8.3 (*)    All other components within normal limits  SARS CORONAVIRUS 2 BY RT PCR (HOSPITAL ORDER, PERFORMED IN [facility])  CULTURE, BLOOD (ROUTINE X 2)  CULTURE, BLOOD (ROUTINE X 2)  CBC WITH DIFFERENTIAL/PLATELET  URIC ACID  HIV ANTIBODY (ROUTINE TESTING W REFLEX)  HEMOGLOBIN A1C  BASIC METABOLIC PANEL  CBC    EKG None  Radiology Dg Wrist Complete Left  Result Date: 03/09/2019 CLINICAL DATA:  Pain and swelling EXAM: LEFT WRIST - COMPLETE 3+ VIEW COMPARISON:  None. FINDINGS: Frontal, oblique, lateral, and ulnar deviation images obtained. No acute fracture or dislocation. There is extensive narrowing of the proximal and mid carpal row regions. There is apparent subchondral cystic change in the proximal scaphoid bone. Milder subchondral cystic changes noted in the distal radius. There is no erosion or bony destruction. IMPRESSION: Extensive osteoarthritic change in the proximal and mid portions of the wrist. No fracture or dislocation. Electronically Signed   By: Bretta BangWilliam  Woodruff III M.D.   On: 03/09/2019 11:32   Ct Hand Left W Contrast  Result Date: 03/09/2019 CLINICAL DATA:  Hand swelling, question of infection EXAM: CT OF THE LEFT HAND WITHOUT CONTRAST TECHNIQUE: Multidetector CT imaging of the left hand was performed according to the standard protocol. Multiplanar CT image reconstructions were also generated. COMPARISON:  Radiograph same day FINDINGS: Bones/Joint/Cartilage There is carpal coalition is noted at the capitate and hamate. There is advanced degenerative changes seen through the carpal/metacarpal joints and the radiocarpal joints with cystic/erosive changes and  diffuse sclerosis. No osseous fracture is noted. Ligaments Suboptimally assessed by CT. Muscles and Tendons The peripherally enhancing fluid is seen surrounding the flexor digitorum tendons at the level of the metacarpal bases and extending predominantly around the third and fourth flexor tendons to the level of the metacarpal heads. There is also increased intrasubstance signal with a small amount of fluid seen surrounding the flexor carpi ulnaris tendon. There is also peripherally enhancing fluid seen extending around the extensor digitorum tendons from the metacarpal bases extending distally to the MCP joints, predominantly around the third and fourth digits. The muscles surrounding the hand appear to be intact without evidence of atrophy or focal tear. Soft tissues Diffuse dorsal subcutaneous edema and skin thickening is noted. IMPRESSION: 1.  Enhancing fluid around the flexor digitorum tendons as well as the extensor digitorum tendons, predominantly the third and fourth digits, as well as the flexor carpi ulnaris tendon. This could be due to inflammatory or infectious tenosynovitis 2. Diffuse dorsal subcutaneous edema and skin thickening. 3. Findings concerning for inflammatory/erosive arthropathy involving the carpals. 4. 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 These results were called by telephone at the time of interpretation on 03/09/2019 at 4:14 pm to provider [place], [place], who verbally acknowledged these results. Electronically Signed   By: Prudencio Pair M.D.   On: 03/09/2019 16:15   Dg Hand Complete Left  Result Date: 03/09/2019 CLINICAL DATA:  Pain and swelling EXAM: LEFT HAND - COMPLETE 3+ VIEW COMPARISON:  None. FINDINGS: Frontal, oblique, and lateral views were obtained. There is generalized soft tissue swelling, particularly more medially. No fracture or dislocation evident. There is persistent flexion of multiple PIP and DIP joints. There is osteoarthritic change throughout the proximal  and mid carpal row regions. No appreciable joint space narrowing more distally. No erosive changes. IMPRESSION: Generalized soft tissue swelling, particularly medially. Persistent flexion of multiple distal joints. Fairly extensive osteoarthritic change in the proximal and mid carpal row regions of the wrist. No appreciable joint space narrowing more distally. No evidence of erosion. Electronically Signed   By: Lowella Grip III M.D.   On: 03/09/2019 11:30    Procedures Procedures (including critical care time)  Medications Ordered in ED Medications  enoxaparin (LOVENOX) injection 40 mg (has no administration in time range)  senna-docusate (Senokot-S) tablet 1 tablet (has no administration in time range)  insulin glargine (LANTUS) injection 8 Units (has no administration in time range)  insulin aspart (novoLOG) injection 0-15 Units (has no administration in time range)  insulin aspart (novoLOG) injection 0-5 Units (has no administration in time range)  fentaNYL (SUBLIMAZE) injection 50 mcg (50 mcg Intravenous Given 03/09/19 1556)  iohexol (OMNIPAQUE) 300 MG/ML solution 100 mL (100 mLs Intravenous Contrast Given 03/09/19 1550)     Initial Impression / Assessment and Plan / ED Course  I have reviewed the triage vital signs and the nursing notes.  Pertinent labs & imaging results that were available during my care of the patient were reviewed by me and considered in my medical decision making (see chart for details).  Clinical Course as of Mar 09 1727  Thu Mar 09, 2019  1200 Consulted with ortho PA Dellis Filbert who evaluated pt at bedside. Agrees pt presentation concerning for infection. Awaiting plan from Dr. Jeannie Fend. Appreciate consult.   [JR]  1829 Per radiology, Tenosynovitis of flexor and extensor tendons of left hand with erosive changes in the carpal joints. Ortho PA Dellis Filbert made aware. Recommends medical admission, OR tonight with Dr. Jeannie Fend. Will hold on abx until cultures can be  obtained in the OR.   [JR]    Clinical Course User Index [JR] Keyarra Rendall, [place] N, PA-C       Patient presenting with 1.5-weeks of worsening atraumatic left hand pain and swelling.  Failed outpatient treatment of prednisone taper and NSAIDs.  Sent to the ED today from PCP with concern for infectious etiology.  History of diabetes, denies history of IV drug use or gout.  No systemic symptoms.  On exam, hand is diffusely swollen and tender, not localized to a particular joint.  No wounds or fluctuance to suggest superficial abscess.  Strong radial pulse.  Limited range of motion secondary to pain and swelling.  Concern for infectious etiology of symptoms,  therefore Ortho PA Earney Hamburg consulted who evaluated patient at bedside.  Agrees with presentation concerning for infection, Dr. Roney Mans hand surgeon made aware of patient, requesting CT scan of hand.   Labs without leukocytosis though elevated CRP and sed rate.  Uric acid within normal limits.  Blood cultures sent.  CT scan consistent with tenosynovitis of both flexor and extensor tendons of the left hand as well as erosive changes in the carpal joints.  Patient to go to the OR tonight with Dr. Roney Mans for washout and cultures.  Hold on antibiotics at this time.  Patient admitted to IM teaching service for medical management.  Patient discussed with and evaluated by Dr. Pilar Plate.  Final Clinical Impressions(s) / ED Diagnoses   Final diagnoses:  Tenosynovitis of left hand    ED Discharge Orders    None       Boruch Manuele, [place] N, PA-C 03/09/19 1729    Sabas Sous, MD 03/10/19 [phone]

## 2019-03-09 NOTE — Assessment & Plan Note (Addendum)
Patient has a history of type 2 diabetes and has been insulin-dependent in the past.  However he has had a difficult time affording Lantus and is currently only taking Metformin 850 mg twice daily.  He does not have a blood glucose meter at home so has not been checking his sugars. He denies polydipsia and polyuria. Planned to obtain CMP, A1c, and lipid profile but patient was taken to the ED for further evaluation of L hand swelling. I asked him to follow up in 1 week. Will provide BG meter and check A1c and other labs then. Prescriptions should be sent to [facility] under IM program.

## 2019-03-09 NOTE — Consult Note (Signed)
Reason for Consult:Left hand pain/swelling Referring Physician: Viona Gilmore  Carlos Charles is an 58 y.o. male.  HPI: Carlos Charles comes in with a ~8d hx/o left hand pain and swelling. He woke up with the pain on the 10th or 11th. He sought care in the ED then and was given some prednisone. He took at least some of this medication and it made no difference. He notes the pain and swelling are about the same now as when it started. No alleviating factors, use aggravates it. No prior hx/o similar, no hx/o gout or recent illness, no fevers, chills, sweats, N/V. The pain keeps him up most of the night. 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  Past Medical History:  Diagnosis Date  . Diabetes mellitus without complication ([place])   . Essential hypertension   . Hyperlipidemia   . Osteoarthritis of L hip     History reviewed. No pertinent surgical history.  Family History  Problem Relation Age of Onset  . Diabetes Mother   . Healthy Father   . Diabetes Sister     Social History:  reports that he has been smoking. He has been smoking about 0.30 packs per day. He has never used smokeless tobacco. He reports current alcohol use. No history on file for drug.  Allergies: No Known Allergies  Medications: I have reviewed the patient's current medications.  No results found for this or any previous visit (from the past 48 hour(s)).  Dg Wrist Complete Left  Result Date: 03/09/2019 CLINICAL DATA:  Pain and swelling EXAM: LEFT WRIST - COMPLETE 3+ VIEW COMPARISON:  None. FINDINGS: Frontal, oblique, lateral, and ulnar deviation images obtained. No acute fracture or dislocation. There is extensive narrowing of the proximal and mid carpal row regions. There is apparent subchondral cystic change in the proximal scaphoid bone. Milder subchondral cystic changes noted in the distal radius. There is no erosion or bony destruction. IMPRESSION: Extensive osteoarthritic change in the proximal and mid  portions of the wrist. No fracture or dislocation. Electronically Signed   By: Lowella Grip III M.D.   On: 03/09/2019 11:32   Dg Hand Complete Left  Result Date: 03/09/2019 CLINICAL DATA:  Pain and swelling EXAM: LEFT HAND - COMPLETE 3+ VIEW COMPARISON:  None. FINDINGS: Frontal, oblique, and lateral views were obtained. There is generalized soft tissue swelling, particularly more medially. No fracture or dislocation evident. There is persistent flexion of multiple PIP and DIP joints. There is osteoarthritic change throughout the proximal and mid carpal row regions. No appreciable joint space narrowing more distally. No erosive changes. IMPRESSION: Generalized soft tissue swelling, particularly medially. Persistent flexion of multiple distal joints. Fairly extensive osteoarthritic change in the proximal and mid carpal row regions of the wrist. No appreciable joint space narrowing more distally. No evidence of erosion. Electronically Signed   By: Lowella Grip III M.D.   On: 03/09/2019 11:30    Review of Systems  Constitutional: Negative for chills, fever and weight loss.  HENT: Negative for ear discharge, ear pain, hearing loss and tinnitus.   Eyes: Negative for blurred vision, double vision, photophobia and pain.  Respiratory: Negative for cough, sputum production and shortness of breath.   Cardiovascular: Negative for chest pain.  Gastrointestinal: Negative for abdominal pain, nausea and vomiting.  Genitourinary: Negative for dysuria, flank pain, frequency and urgency.  Musculoskeletal: Positive for joint pain (Left hand/wrist). Negative for back pain, falls, myalgias and neck pain.  Neurological: Negative for dizziness, tingling,  sensory change, focal weakness, loss of consciousness and headaches.  Endo/Heme/Allergies: Does not bruise/bleed easily.  Psychiatric/Behavioral: Negative for depression, memory loss and substance abuse. The patient is not nervous/anxious.    Blood pressure  (!) 173/90, pulse 72, temperature 98.4 F (36.9 C), temperature source Oral, resp. rate 16, SpO2 98 %. Physical Exam  Constitutional: He appears well-developed and well-nourished. No distress.  HENT:  Head: Normocephalic and atraumatic.  Eyes: Conjunctivae are normal. Right eye exhibits no discharge. Left eye exhibits no discharge. No scleral icterus.  Neck: Normal range of motion.  Cardiovascular: Normal rate and regular rhythm.  Respiratory: Effort normal. No respiratory distress.  Musculoskeletal:     Comments: Left shoulder, elbow, wrist, digits- no skin wounds, hand moderately edematous, mod TTP, sever pain with passive wrist ROM, mild erythema dorsum of hand, mild pain with ROM of thumb, ring, and little fingers, hand warm compared to right, no instability, no blocks to motion  Sens  Ax/R/M/U intact, some paresthesias dorsum of index and long fingers  Mot   Ax/ R/ PIN/ M/ AIN/ U grossly intact but limited by pain  Rad 2+  Neurological: He is alert.  Skin: Skin is warm and dry. He is not diaphoretic.  Psychiatric: He has a normal mood and affect. His behavior is normal.    Assessment/Plan: Left hand pain/swelling -- CT shows fluid collection. Will take to OR for I&D, may take multiple trips. Will ask medicine to admit to manage IV abx and chronic medical problems. Multiple medical problems including poorly controlled DM and HTN -- per internal medicine. Appreciate their help.    Freeman Caldron, PA-C Orthopedic Surgery [phone] 03/09/2019, 12:17 PM

## 2019-03-09 NOTE — Anesthesia Preprocedure Evaluation (Signed)
Anesthesia Evaluation  Patient identified by MRN, date of birth, ID band Patient awake    Reviewed: Allergy & Precautions, NPO status , Patient's Chart, lab work & pertinent test results  Airway Mallampati: II  TM Distance: >3 FB Neck ROM: Full    Dental  (+) Dental Advisory Given   Pulmonary Current Smoker,    breath sounds clear to auscultation       Cardiovascular hypertension, Pt. on medications  Rhythm:Regular Rate:Normal     Neuro/Psych negative neurological ROS     GI/Hepatic negative GI ROS, Neg liver ROS,   Endo/Other  diabetes, Type 2, Oral Hypoglycemic Agents  Renal/GU negative Renal ROS     Musculoskeletal  (+) Arthritis ,   Abdominal   Peds  Hematology negative hematology ROS (+)   Anesthesia Other Findings   Reproductive/Obstetrics                             Anesthesia Physical Anesthesia Plan  ASA: II  Anesthesia Plan: General   Post-op Pain Management:    Induction: Intravenous  PONV Risk Score and Plan: 1 and Dexamethasone, Ondansetron and Treatment may vary due to age or medical condition  Airway Management Planned: LMA  Additional Equipment:   Intra-op Plan:   Post-operative Plan: Extubation in OR  Informed Consent: I have reviewed the patients History and Physical, chart, labs and discussed the procedure including the risks, benefits and alternatives for the proposed anesthesia with the patient or authorized representative who has indicated his/her understanding and acceptance.     Dental advisory given  Plan Discussed with: CRNA  Anesthesia Plan Comments:         Anesthesia Quick Evaluation

## 2019-03-09 NOTE — Assessment & Plan Note (Addendum)
Patient presenting with L hand sweling x 1-2 weeks.  He reports this occurred suddenly 2 weeks ago when he woke up and noticed swelling at the wrist.  Swelling has been expanding and worsening since then and now all his digits are swollen.  He went to urgent care yesterday and received naproxen and prednisone for gout but was not able to tolerate it. He noticed some mild improvement in swelling with prednisone but not much. Has been taking BC powder and neurontin without relief.  Denies fever and chills, and recent trauma.  Denies history of rheumatologic disease.  He reports consumption of fish and alcohol, no red meats.  No history of gout that he knows of.  Unclear as to the etiology of the swelling but I am concerned he may have an infection in the hand given the swelling and limited ROM.  Erythema and warmth may have resolved after taking steroids for a few day so unable to rule out infection at this time.  Gout is also in the differential although symptoms did not improve after 3 days of NSAIDs and prednisone.  Patient is uninsured and unable to afford imaging or referral to hand surgery.  We will send to the ED for blood work, hand imaging, and hand surgery consult.  IM Toradol 30 mg given for pain. I asked him to follow-up with [place] in 1 week.

## 2019-03-09 NOTE — Progress Notes (Signed)
   CC: Establish care, T2DM, HTN, L hand swelling   HPI:  [name] is a 58 y.o. year-old male with PMH listed below who presents to clinic for establish care, T2DM, HTN, and left hand swelling. Please see problem based assessment and plan for further details.   Past Medical History:  Diagnosis Date  . Diabetes mellitus without complication ([place])   . Essential hypertension   . Hyperlipidemia   . 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  He is a current smoker, about 1 PPD.  He has been able to quit for 10 years before.  He also uses alcohol, reports about 2-3 beers in the weekends.  Denies any drug use.  Review of Systems:   Review of Systems  Constitutional: Negative for chills, fever and malaise/fatigue.  Musculoskeletal: Positive for joint pain.    Physical Exam:  Vitals:   03/09/19 0925  BP: (!) 165/101  Pulse: 83  Temp: 98.1 F (36.7 C)  TempSrc: Oral  SpO2: 100%  Weight: 173 lb (78.5 kg)  Height: 5\' 11"  (1.803 m)    General: Well-appearing male in mild distress due to pain Cardiac: regular rate and rhythm, nl S1/S2, no murmurs, rubs or gallops Pulm: CTAB, no wheezes or crackles, no increased work of breathing on room air  MSK: Left hand is significantly swollen including the knuckles in all digits.  There is no warmth or erythema noted.  There is no range of motion at the wrist due to swelling and ROM of digits is very very limited due to swelling as well.  No trauma or puncture wounds noted.  No rashes or lesions   Assessment & Plan:   See Encounters Tab for problem based charting.  Patient discussed with Dr. Dareen Piano

## 2019-03-09 NOTE — Progress Notes (Signed)
Patient was transferred via wheelchair to the ER for swollen hand.  Patient remained oriented and talking.  Hand-off to EMT.  Sander Nephew, RN 03/09/2019 Sander Nephew, RN 03/09/2019 10:20 AM.

## 2019-03-10 ENCOUNTER — Encounter (HOSPITAL_COMMUNITY): Payer: Self-pay | Admitting: Orthopaedic Surgery

## 2019-03-10 DIAGNOSIS — M159 Polyosteoarthritis, unspecified: Secondary | ICD-10-CM

## 2019-03-10 DIAGNOSIS — G8929 Other chronic pain: Secondary | ICD-10-CM

## 2019-03-10 DIAGNOSIS — E785 Hyperlipidemia, unspecified: Secondary | ICD-10-CM

## 2019-03-10 LAB — CBC
HCT: 38.6 % — ABNORMAL LOW (ref 39.0–52.0)
Hemoglobin: 13.2 g/dL (ref 13.0–17.0)
MCH: 32.2 pg (ref 26.0–34.0)
MCHC: 34.2 g/dL (ref 30.0–36.0)
MCV: 94.1 fL (ref 80.0–100.0)
Platelets: 276 10*3/uL (ref 150–400)
RBC: 4.1 MIL/uL — ABNORMAL LOW (ref 4.22–5.81)
RDW: 12.1 % (ref 11.5–15.5)
WBC: 9.8 10*3/uL (ref 4.0–10.5)
nRBC: 0 % (ref 0.0–0.2)

## 2019-03-10 LAB — BASIC METABOLIC PANEL
Anion gap: 14 (ref 5–15)
BUN: 9 mg/dL (ref 6–20)
CO2: 23 mmol/L (ref 22–32)
Calcium: 9.2 mg/dL (ref 8.9–10.3)
Chloride: 98 mmol/L (ref 98–111)
Creatinine, Ser: 0.56 mg/dL — ABNORMAL LOW (ref 0.61–1.24)
GFR calc Af Amer: >60 mL/min (ref >60)
GFR calc non Af Amer: >60 mL/min (ref >60)
Glucose, Bld: 255 mg/dL — ABNORMAL HIGH (ref 70–99)
Potassium: 4.7 mmol/L (ref 3.5–5.1)
Sodium: 135 mmol/L (ref 135–145)

## 2019-03-10 LAB — GLUCOSE, CAPILLARY
Glucose-Capillary: 314 mg/dL — ABNORMAL HIGH (ref 70–99)
Glucose-Capillary: 231 mg/dL — ABNORMAL HIGH (ref 70–99)
Glucose-Capillary: 211 mg/dL — ABNORMAL HIGH (ref 70–99)
Glucose-Capillary: 265 mg/dL — ABNORMAL HIGH (ref 70–99)

## 2019-03-10 LAB — HIV ANTIBODY (ROUTINE TESTING W REFLEX): HIV Screen 4th Generation wRfx: NONREACTIVE

## 2019-03-10 MED ORDER — HYDROMORPHONE HCL 2 MG PO TABS
2.0000 mg | ORAL_TABLET | ORAL | Status: DC | PRN
  Administered 2019-03-11 – 2019-03-12 (×7): 2 mg via ORAL
  Filled 2019-03-10 (×7): qty 1

## 2019-03-10 MED ORDER — INSULIN ASPART 100 UNIT/ML ~~LOC~~ SOLN
4.0000 [IU] | Freq: Three times a day (TID) | SUBCUTANEOUS | Status: DC
  Administered 2019-03-11 – 2019-03-13 (×4): 4 [IU] via SUBCUTANEOUS

## 2019-03-10 MED ORDER — VANCOMYCIN HCL 10 G IV SOLR
1250.0000 mg | Freq: Two times a day (BID) | INTRAVENOUS | Status: DC
  Administered 2019-03-11 – 2019-03-13 (×5): 1250 mg via INTRAVENOUS
  Filled 2019-03-10 (×6): qty 1250

## 2019-03-10 MED ORDER — HYDROCODONE-ACETAMINOPHEN 5-325 MG PO TABS: 1.0000 | ORAL_TABLET | ORAL | Status: DC | PRN

## 2019-03-10 MED ORDER — ACETAMINOPHEN 325 MG PO TABS: 650.0000 mg | ORAL_TABLET | ORAL | Status: DC | PRN

## 2019-03-10 MED ORDER — SODIUM CHLORIDE 0.9 % IV SOLN
2.0000 g | Freq: Three times a day (TID) | INTRAVENOUS | Status: DC
  Administered 2019-03-10 – 2019-03-13 (×10): 2 g via INTRAVENOUS
  Filled 2019-03-10 (×10): qty 2

## 2019-03-10 MED ORDER — KETOROLAC TROMETHAMINE 30 MG/ML IJ SOLN
30.0000 mg | Freq: Three times a day (TID) | INTRAMUSCULAR | Status: DC | PRN
  Administered 2019-03-10 – 2019-03-12 (×4): 30 mg via INTRAVENOUS
  Filled 2019-03-10 (×4): qty 1

## 2019-03-10 MED ORDER — VANCOMYCIN HCL 10 G IV SOLR
1500.0000 mg | Freq: Once | INTRAVENOUS | Status: AC
  Administered 2019-03-10: 1500 mg via INTRAVENOUS
  Filled 2019-03-10: qty 1500

## 2019-03-10 MED ORDER — HYDROMORPHONE HCL 2 MG PO TABS
1.0000 mg | ORAL_TABLET | ORAL | Status: DC | PRN
  Filled 2019-03-10: qty 1

## 2019-03-10 MED ORDER — HYDROMORPHONE HCL 2 MG PO TABS: 1.0000 mg | ORAL_TABLET | Freq: Four times a day (QID) | ORAL | Status: DC | PRN

## 2019-03-10 MED ORDER — INSULIN GLARGINE 100 UNIT/ML ~~LOC~~ SOLN
12.0000 [IU] | Freq: Every day | SUBCUTANEOUS | Status: DC
  Administered 2019-03-11 – 2019-03-12 (×2): 12 [IU] via SUBCUTANEOUS
  Filled 2019-03-10 (×3): qty 0.12

## 2019-03-10 MED ORDER — HYDROMORPHONE HCL 2 MG PO TABS
1.0000 mg | ORAL_TABLET | ORAL | Status: DC | PRN
  Administered 2019-03-10 (×3): 1 mg via ORAL
  Filled 2019-03-10 (×3): qty 1

## 2019-03-10 MED ORDER — ACETAMINOPHEN 500 MG PO TABS
1000.0000 mg | ORAL_TABLET | Freq: Four times a day (QID) | ORAL | Status: DC
  Administered 2019-03-10 – 2019-03-13 (×10): 1000 mg via ORAL
  Filled 2019-03-10 (×9): qty 2

## 2019-03-10 MED ORDER — HYDROMORPHONE HCL 2 MG PO TABS
1.0000 mg | ORAL_TABLET | ORAL | Status: DC | PRN
  Administered 2019-03-10: 1 mg via ORAL

## 2019-03-10 MED ORDER — INSULIN GLARGINE 100 UNIT/ML ~~LOC~~ SOLN
8.0000 [IU] | Freq: Every day | SUBCUTANEOUS | Status: DC
  Administered 2019-03-10: 8 [IU] via SUBCUTANEOUS
  Filled 2019-03-10: qty 0.08

## 2019-03-10 NOTE — Progress Notes (Signed)
   Ortho Hand Progress Note  Subjective: No acute events last night. States pain in the left hand is improved when compared to pre-op pain. Denies numbness or tingling in the fingertips   Objective: Vital signs in last 24 hours: Temp:  [97.7 F (36.5 C)-98.4 F (36.9 C)] 98.3 F (36.8 C) (11/20 0552) Pulse Rate:  [67-86] 67 (11/20 0552) Resp:  [10-16] 16 (11/20 0552) BP: (140-173)/(58-101) 157/97 (11/20 0552) SpO2:  [98 %-100 %] 100 % (11/20 0552) Weight:  [78.5 kg] 78.5 kg (11/19 1816)  Intake/Output from previous day: 11/19 0701 - 11/20 0700 In: 720 [P.O.:720] Out: 1700 [Urine:1700] Intake/Output this shift: Total I/O In: 720 [P.O.:720] Out: 1700 [Urine:1700]  Recent Labs    03/09/19 1138 03/10/19 0459  HGB 13.6 13.2   Recent Labs    03/09/19 1138 03/10/19 0459  WBC 8.2 9.8  RBC 4.27 4.10*  HCT 41.6 38.6*  PLT 315 276   Recent Labs    03/09/19 1138 03/10/19 0459  NA 136 135  K 4.0 4.7  CL 101 98  CO2 24 23  BUN 10 9  CREATININE 0.70 0.56*  GLUCOSE 199* 255*  CALCIUM 9.1 9.2   No results for input(s): LABPT, INR in the last 72 hours.  Aaox3 nad Resp nonlabored RRR LUE: dressings c/d/i. Intact flex/ex to the digits without significant pain. Minimal swelling to the digits. Fingers wwp with bcr. SILT throughout the finger tips   Assessment/Plan: Left hand dorsal and palmar infections s/p I&D with palmar/carpal tunnel release, dorsal I&D with arthrotomy, POD1.   - Medicine primary. Appreciate management - Dressing change and drain pull tomorrow - Cxs. Pending - Abx per primary. Would recommend ID consult pending cx results - Elevate LUE - Remainder per primary    Avanell Shackleton III 03/10/2019, 6:54 AM  [phone]

## 2019-03-10 NOTE — Progress Notes (Signed)
Pharmacy Antibiotic Note  Carlos Charles is a 58 y.o. male admitted on 03/09/2019 with L hand tenosynovitis. No abx received PTA. S/p I&D with palmar/carpal tunnel release, dorsal I&D with arthrotomy on 11/19. Received 1 dose of Ancef 2 gm IV intra-op. Pharmacy has been consulted for vancomycin and cefepime dosing.  Pt is afebrile, WBC wnl at 9.8. Renal fxn wnl, SCr 0.56.  Plan: Vancomycin loading dose 1500 mg IV once Vancomycin 1250 mg IV Q 12 hrs. Goal AUC 400-550. Expected AUC: 462.9 SCr used: 0.80 Cefepime 2 gm IV q8h Monitor pt's renal fxn, clinical improvement, cxs, and abx de-escalation   Height: 5\' 11"  (180.3 cm) Weight: 173 lb (78.5 kg) IBW/kg (Calculated) : 75.3  Temp (24hrs), Avg:98 F (36.7 C), Min:97.7 F (36.5 C), Max:98.3 F (36.8 C)  Recent Labs  Lab 03/09/19 1138 03/10/19 0459  WBC 8.2 9.8  CREATININE 0.70 0.56*    Estimated Creatinine Clearance: 109.8 mL/min (A) (by C-G formula based on SCr of 0.56 mg/dL (L)).    No Known Allergies  Antimicrobials this admission: 11/19 Ancef x 1 intra-op 11/20 Vanc >> 11/20 Cefepime >>  Microbiology results: 11/19 L palm Gram stain: no organism seen 11/19 L palm cx: NG <12h 11/19 L tridorsal Gram stain: no organism seen 11/19 L tridorsal cx: NG <12h 11/19 Bcx: pending  Thank you for allowing pharmacy to be a part of this patient's care.  Berenice Bouton, PharmD PGY1 Pharmacy Resident Office phone: [phone] 03/10/2019 11:00 AM

## 2019-03-10 NOTE — Progress Notes (Addendum)
   Subjective:  No acute events overnight.  His hand pain has improved significantly after I&D yesterday.  BP has continued to run slightly high overnight but otherwise vital signs stable.  Again today reiterates no history of wound including insect bites.  No recent exposure to sea water or hot tubs.  No other skin changes or joint pain other than chronic hip pain and intermittent knee pain which are longstanding and he attributes to osteoarthritis.  Objective:  Vital signs in last 24 hours: Vitals:   03/09/19 2222 03/10/19 0013 03/10/19 0552 03/10/19 0746  BP: (!) 144/59 (!) 154/87 (!) 157/97 (!) 150/92  Pulse: 73 86 67 78  Resp: 10 16 16 16   Temp: 09.3 F (36.6 C) 98.1 F (36.7 C) 98.3 F (36.8 C) 98 F (36.7 C)  TempSrc:  Oral Oral Oral  SpO2: 98% 99% 100% 91%  Weight:      Height:       Weight change:   Intake/Output Summary (Last 24 hours) at 03/10/2019 1208 Last data filed at 03/10/2019 0900 Gross per 24 hour  Intake 960 ml  Output 1700 ml  Net -740 ml    General: awake, alert, sitting comfortably on side of bed in NAD Pulm: lungs clear to auscultation bilaterally, normal work of breathing on room air CV: regular rate and rhythm, no murmurs, rubs or gallups MSK: no peripheral edema; dressings on left upper extremity are clean, dry and intact Neuro: A&Ox3; no focal deficits Skin: warm and dry  Psych: normal mood and affect     Assessment/Plan:  Principal Problem:   Tenosynovitis of left hand Active Problems:   Type 2 diabetes mellitus (HCC)   Hypertension   58 y.o. male with a PMHx of type 2 diabetes, hypertension and hyperlipidemia who presented with tenosynovitis of left hand..  Tenosynovitis of left hand: Now s/p I&D with palmar/carpal tunnel release and dorsal I&D with arthrotomy.  Likely infectious given abundant purulent material drained during I&D.  Still unclear etiology, no known portal of entry, although hyperglycemia likely contributed.  Still  afebrile, WBC normal at 9.8, HDS, no signs of sepsis.   - On culture results so far, no organisms seen on Gram stain, no growth at <12 hours - Will start broad-spectrum antibiotics today and narrow based on further surgical culture results - Start IV vancomycin and cefepime today (dosage per Pharmacy) - Postsurgical hand care per Orthopedics - Dilaudid q4hrs prn for severe pain  Type 2 diabetes: Poorly-controlled due to being unable to afford insulin  A1c 9 yesterday. - BG has been running high overnight.  Did not receive Lantus last night due to procedure.  Also received Decadron 5 units last night. - Start Lantus at 8 units this morning (lower than prior stated dose of 16u as he is now somewhat insulin-naive) - SSI with meals  Hypertension: BP running high overnight but he did not take his lisinopril 10mg  QD yesterday - Start lisinopril 10mg  QD this morning   CODE STATUS: FULL Diet: Carb-modified DVT prophylaxis: Lovenox 40mg  subq daily  Dispo: Anticipated discharge pending clinical status   LOS: 1 day   Tonia Ghent, Medical Student 03/10/2019, 12:08 PM

## 2019-03-10 NOTE — Evaluation (Signed)
Occupational Therapy Evaluation Patient Details Name: Carlos Charles MRN: [id number] DOB: March 09, 1961 Today's Date: 03/10/2019    History of Present Illness Carlos Charles is a 58 y.o. male with PMHx of type 2 diabetes mellitus and hypertension who presents with left hand pain and swelling.  Patient states he was in his usual state of health until he awoke about a week ago with swelling and pain to the top of his left hand.  This progressively worsened and he also developed redness. Pt underwent I & D of l hand   Clinical Impression   Pt with decline in function with ADLs with impaired strength, ROM and function of L UE. Pt l hand/wrist immobilized with edema and 5/10 pain. Pt reports that PTA, he lived at home with his girlfriend and that he was independent with ADLs/selfcare and used a caner on occasion due to "a bad hip". Pt currently requires no physical assist for be mobility or ambulation, but does requires set up - min A with ADLs due to L UE immobilization, pain and edema. Pt instructed on proper positioning of L UE in elevation for edema mgt and gentle ROM exercises of L digits in flexion, extension and opposition. Pt would benefit from acute OT services to address impairments to maximize level of function and safety    Follow Up Recommendations  Follow surgeon's recommendation for DC plan and follow-up therapies;Outpatient OT    Equipment Recommendations  None recommended by OT    Recommendations for Other Services       Precautions / Restrictions Precautions Precautions: Other (comment) Required Braces or Orthoses: Splint/Cast Splint/Cast: L forearm Restrictions Weight Bearing Restrictions: No      Mobility Bed Mobility Overal bed mobility: Modified Independent                Transfers Overall transfer level: Independent               General transfer comment: no physical assist required, Good balance    Balance Overall balance assessment: No apparent  balance deficits (not formally assessed)                                         ADL either performed or assessed with clinical judgement   ADL Overall ADL's : Needs assistance/impaired Eating/Feeding: Set up;Independent   Grooming: Set up;Supervision/safety;Standing   Upper Body Bathing: Set up   Lower Body Bathing: Minimal assistance   Upper Body Dressing : Set up;Minimal assistance   Lower Body Dressing: Minimal assistance   Toilet Transfer: Independent;Ambulation;Regular Museum/gallery exhibitions officer and Hygiene: Min guard       Functional mobility during ADLs: Independent General ADL Comments: set up - min A required due to L hand/wrist immobilized     Vision Patient Visual Report: No change from baseline       Perception     Praxis      Pertinent Vitals/Pain Pain Assessment: 0-10 Pain Score: 5  Pain Location: L hand/wrist Pain Descriptors / Indicators: Throbbing;Sore Pain Intervention(s): Monitored during session;Repositioned;Patient requesting pain meds-RN notified     Hand Dominance Right   Extremity/Trunk Assessment Upper Extremity Assessment Upper Extremity Assessment: LUE deficits/detail LUE Deficits / Details: L hand/wrist cast/splint LUE: Unable to fully assess due to immobilization       Cervical / Trunk Assessment Cervical / Trunk Assessment: Normal   Communication Communication Communication: No difficulties  Cognition Arousal/Alertness: Awake/alert Behavior During Therapy: WFL for tasks assessed/performed Overall Cognitive Status: Within Functional Limits for tasks assessed                                     General Comments       Exercises Other Exercises Other Exercises: pt instructed on ROM of L digits: flexion, extension and opposition Other Exercises: pt edcuated on elevated positioning of l UE for edema mgt   Shoulder Instructions      Home Living Family/patient expects to  be discharged to:: Private residence Living Arrangements: Spouse/significant other Available Help at Discharge: Available PRN/intermittently Type of Home: House Home Access: Stairs to enter [employer] of Steps: 2-3 Entrance Stairs-Rails: None Home Layout: One level     Bathroom Shower/Tub: Tub/shower unit;Walk-in shower   Bathroom Toilet: Standard     Home Equipment: Cane - single point   Additional Comments: uses cane on occasion due to "bad hip"      Prior Functioning/Environment Level of Independence: Independent                 OT Problem List: Impaired UE functional use;Decreased coordination;Decreased activity tolerance;Decreased range of motion;Pain      OT Treatment/Interventions:      OT Goals(Current goals can be found in the care plan section) Acute Rehab OT Goals Patient Stated Goal: go home today OT Goal Formulation: With patient Time For Goal Achievement: 03/24/19 Potential to Achieve Goals: Good ADL Goals Pt Will Perform Grooming: with modified independence;standing Pt Will Perform Lower Body Bathing: with min guard assist;with supervision;with set-up;with caregiver independent in assisting Pt Will Perform Lower Body Dressing: with min guard assist;with supervision;with set-up;sit to/from stand;with caregiver independent in assisting Pt Will Perform Toileting - Clothing Manipulation and hygiene: with supervision;with modified independence;sit to/from stand Additional ADL Goal #1: Pt will tolerate gentle ROM/self ROM exercises of L hand/digits at instructed  OT Frequency:     Barriers to D/C:            Co-evaluation              AM-PAC OT "6 Clicks" Daily Activity     Outcome Measure Help from another person eating meals?: None Help from another person taking care of personal grooming?: A Little Help from another person toileting, which includes using toliet, bedpan, or urinal?: A Little Help from another person bathing  (including washing, rinsing, drying)?: A Little Help from another person to put on and taking off regular upper body clothing?: A Little Help from another person to put on and taking off regular lower body clothing?: A Little 6 Click Score: 19   End of Session Equipment Utilized During Treatment: Gait belt  Activity Tolerance: Patient tolerated treatment well Patient left: in chair;with call bell/phone within reach  OT Visit Diagnosis: Pain Pain - Right/Left: Left Pain - part of body: Arm                Time: 1962-2297 OT Time Calculation (min): 38 min Charges:  OT General Charges $OT Visit: 1 Visit OT Evaluation $OT Eval Moderate Complexity: 1 Mod OT Treatments $Self Care/Home Management : 8-22 mins $Therapeutic Activity: 8-22 mins    Galen Manila 03/10/2019, 11:39 AM

## 2019-03-10 NOTE — Progress Notes (Signed)
Internal Medicine Clinic Attending  I saw and evaluated the patient.  I personally confirmed the key portions of the history and exam documented by Dr. Santos-Sanchez and I reviewed pertinent patient test results.  The assessment, diagnosis, and plan were formulated together and I agree with the documentation in the resident's note. 

## 2019-03-10 NOTE — Progress Notes (Signed)
PT Cancellation Note  Patient Details Name: Carlos Charles MRN: [id number] DOB: Aug 02, 1960   Cancelled Treatment:    Reason Eval/Treat Not Completed: PT screened, no needs identified, will sign off. Per OT, pt does not have need for PT in the acute setting. PT then went to discuss role of PT in hospital and offer services which the pt also agreed he did not need at this time. If there is a significant change in status or new need for PT to evaluate please do not hesitate to re-consult.   Mickey Farber, PT, DPT   Acute Rehabilitation Department [phone]   Otho Bellows 03/10/2019, 12:20 PM

## 2019-03-10 NOTE — Progress Notes (Signed)
Inpatient Diabetes Program Recommendations  AACE/ADA: New Consensus Statement on Inpatient Glycemic Control (2015)  Target Ranges:  Prepandial:   less than 140 mg/dL      Peak postprandial:   less than 180 mg/dL (1-2 hours)      Critically ill patients:  140 - 180 mg/dL   Lab Results  Component Value Date   GLUCAP 231 (H) 03/10/2019   HGBA1C 9.0 (H) 03/09/2019    Review of Glycemic Control  Diabetes history: DM2 Outpatient Diabetes medications: Metformin 850 mg BID  Current orders for Inpatient glycemic control:   Lantus 8 units QHS, Novolog 0-15 units TID with meals, Novolog 0-5 units QHS  Note:  Spoke with patient at bedside regarding current A1C of 9%.  Explained this is an average blood sugar reading of 212 mg/dl over the last 2-3 months and the risks of having an elevated A1C.  He states he normally drinks Gatorade Zero and water.  Does not drink any drinks with sugar.  He works at [employer] and normally eats grilled chicken and vegetables.  He admits really he likes bread; encouraged him to limit carbohydrates and explained what foods contain carbohydrates.  Encouraged to increase physical activity if able; he states he has a hard time walking as he needs a hip replacement.  He does not have insurance or a PCP.  He was recently incarcerated and was given a couple of bottles of Metformin from where he was released.    He states he has been on insulin in the past and could not afford it.  He has used vials and syringes in the past.  He has checked his blood sugar in the past but does not currently have a meter.  Explained he can obtain a ReliOn meter and strips at [facility] for approx. $25.  Explained if he is discharged on 70/30 he can also obtain at [facility] for $25 and Metformin is on the $4 list at [facility].  He states he can afford the $4 but probably not the $25 for insulin.  DM coordinator placed CM order for medication needs and no PCP.  Explained he will need a PCP to follow up with for  prescription refills.  He verbalizes understanding.      Thank you, Geoffry Paradise, RN, BSN Diabetes Coordinator Inpatient Diabetes Program [phone] (team pager from 8a-5p)

## 2019-03-10 NOTE — Plan of Care (Signed)

## 2019-03-10 NOTE — TOC Initial Note (Signed)
Transition of Care [facility]) - Initial/Assessment Note    Patient Details  Name: Carlos Charles MRN: [id number] Date of Birth: [birth year]  Transition of Care [facility]) CM/SW Contact:    Bartholomew Crews, RN Phone Number: [phone] 03/10/2019, 5:13 PM  Clinical Narrative:                 Received consult for medication assistance. Discussed options for lantus vs relion brand with diabetes coordinator. Spoke with patient at bedside. Lives alone in apartment. Works 2 jobs, but not getting a lot of hours since pandemic. No insurance. Goes to internal medicine clinic for primary care.   Discussed Match letter for medications. Will provide Match letter at discharge.  Assisted patient with completing patient assistance for lantus medication through [facility] - MD completed prescriber section - faxed to [phone].   TOC following for transitions.   Expected Discharge Plan: Home/Self Care Barriers to Discharge: Continued Medical Work up   Patient Goals and CMS Choice   CMS Medicare.gov Compare Post Acute Care list provided to:: Patient Choice offered to / list presented to : NA  Expected Discharge Plan and Services Expected Discharge Plan: Home/Self Care   Discharge Planning Services: [place] arrangements for the past 2 months: Apartment                 DME Arranged: N/A DME Agency: NA       HH Arranged: NA HH Agency: NA        Prior Living Arrangements/Services Living arrangements for the past 2 months: Apartment Lives with:: Self Patient language and need for interpreter reviewed:: Yes Do you feel safe going back to the place where you live?: Yes            Criminal Activity/Legal Involvement Pertinent to Current Situation/Hospitalization: No - Comment as needed  Activities of Daily Living Home Assistive Devices/Equipment: None ADL Screening (condition at time of admission) Patient's cognitive ability adequate to safely complete daily activities?: Yes Is the  patient deaf or have difficulty hearing?: No Does the patient have difficulty seeing, even when wearing glasses/contacts?: No Does the patient have difficulty concentrating, remembering, or making decisions?: No Patient able to express need for assistance with ADLs?: Yes Does the patient have difficulty dressing or bathing?: No Independently performs ADLs?: Yes (appropriate for developmental age) Does the patient have difficulty walking or climbing stairs?: No Weakness of Legs: None Weakness of Arms/Hands: Left  Permission Sought/Granted                  Emotional Assessment Appearance:: Appears stated age Attitude/Demeanor/Rapport: Engaged Affect (typically observed): Accepting   Alcohol / Substance Use: Not Applicable Psych Involvement: No (comment)  Admission diagnosis:  Tenosynovitis of left hand [M65.9] Patient Active Problem List   Diagnosis Date Noted  . Type 2 diabetes mellitus ([place]) 03/09/2019  . Hypertension 03/09/2019  . Hand swelling, left 03/09/2019  . Tenosynovitis of left hand 03/09/2019   PCP:  Welford Roche, MD Pharmacy:   [facility], [place] AT [address] [place] [address] Phone: [phone] Fax: [place], [place]. [address] [place] [address] Phone: [phone] Fax: [phone]     Social Determinants of Health (SDOH) Interventions    Readmission Risk Interventions No flowsheet data found.

## 2019-03-10 NOTE — Progress Notes (Signed)
Inpatient Diabetes Program Recommendations  AACE/ADA: New Consensus Statement on Inpatient Glycemic Control   Target Ranges:  Prepandial:   less than 140 mg/dL      Peak postprandial:   less than 180 mg/dL (1-2 hours)      Critically ill patients:  140 - 180 mg/dL   Results for Carlos Charles, Carlos Charles (MRN [id number]) as of 03/10/2019 09:16  Ref. Range 03/09/2019 17:51 03/09/2019 21:18 03/10/2019 06:51  Glucose-Capillary Latest Ref Range: 70 - 99 mg/dL 171 (H) 112 (H) 314 (H)  Results for Carlos Charles, Carlos Charles (MRN [id number]) as of 03/10/2019 09:16  Ref. Range 03/09/2019 21:41  Hemoglobin A1C Latest Ref Range: 4.8 - 5.6 % 9.0 (H)   Review of Glycemic Control  Diabetes history: DM2 Outpatient Diabetes medications: Metformin 850 mg BID Current orders for Inpatient glycemic control: Lantus 8 units QHS, Novolog 0-15 units TID with meals, Novolog 0-5 units QHS  Inpatient Diabetes Program Recommendations:   HbgA1C:  A1C 9% on 03/09/19 indicating an average glucose of 212 mg/dl over the past 2-3 months.  NOTE: In reviewing chart, noted patient seen in urgent care on 03/01/19 and patient was prescribed Prednisone for possible gout of left wrist and referred to [facility] Medicine to establish care. Patient is uninsured and per H&P, patient was taking Lantus in the past but not able to continue due to cost. Patient admitted on 03/09/19 with tenosynovitis of left hand and patient had surgery on left hand yesterday evening. Noted patient received Decadron 5 units at 20:01 on 03/09/19 which is contributing to noted hyperglycemia today (fasting glucose). Noted Lantus ordered which patient received today at 13:03. Have tried multiple times to call patient over the phone (on room phone and cell phone) but no answer despite calling unit and asking secretary to let patient know I was calling to talk with him. Will ask on campus Diabetes Coordinator to go by and talk with patient. Placed consult for CM to medication needs.     Thanks, Barnie Alderman, RN, MSN, CDE Diabetes Coordinator Inpatient Diabetes Program [phone] (Team Pager from 8am to 5pm)

## 2019-03-11 LAB — CBC
HCT: 35.2 % — ABNORMAL LOW (ref 39.0–52.0)
Hemoglobin: 11.8 g/dL — ABNORMAL LOW (ref 13.0–17.0)
MCH: 31.6 pg (ref 26.0–34.0)
MCHC: 33.5 g/dL (ref 30.0–36.0)
MCV: 94.1 fL (ref 80.0–100.0)
Platelets: 251 10*3/uL (ref 150–400)
RBC: 3.74 MIL/uL — ABNORMAL LOW (ref 4.22–5.81)
RDW: 12.1 % (ref 11.5–15.5)
WBC: 9.4 10*3/uL (ref 4.0–10.5)
nRBC: 0 % (ref 0.0–0.2)

## 2019-03-11 LAB — GLUCOSE, CAPILLARY
Glucose-Capillary: 168 mg/dL — ABNORMAL HIGH (ref 70–99)
Glucose-Capillary: 202 mg/dL — ABNORMAL HIGH (ref 70–99)
Glucose-Capillary: 94 mg/dL (ref 70–99)
Glucose-Capillary: 316 mg/dL — ABNORMAL HIGH (ref 70–99)

## 2019-03-11 MED ORDER — SENNOSIDES-DOCUSATE SODIUM 8.6-50 MG PO TABS
2.0000 | ORAL_TABLET | Freq: Two times a day (BID) | ORAL | Status: DC
  Administered 2019-03-11 – 2019-03-13 (×3): 2 via ORAL
  Filled 2019-03-11 (×5): qty 2

## 2019-03-11 NOTE — Plan of Care (Signed)
  Problem: Health Behavior/Discharge Planning: Goal: Ability to manage health-related needs will improve Outcome: Progressing   Problem: Activity: Goal: Risk for activity intolerance will decrease Outcome: Progressing   Problem: Elimination: Goal: Will not experience complications related to urinary retention Outcome: Completed/Met   Problem: Pain Managment: Goal: General experience of comfort will improve Outcome: Progressing

## 2019-03-11 NOTE — Progress Notes (Signed)
   Subjective: On Toradol for pain and Dilaudid for severe pain. Overnight requiring more pain medication, but feels like pain is improved this morning. Drain removed from left hand by Ortho this morning. No bowel movement yesterday. Denies fever and chills.    Objective:  Vital signs in last 24 hours: Vitals:   03/10/19 0746 03/10/19 1617 03/10/19 1925 03/11/19 0339  BP: (!) 150/92 (!) 154/89 (!) 164/96 (!) 166/96  Pulse: 78 76 84 66  Resp: 16 16 18 18   Temp: 98 F (36.7 C) 97.7 F (36.5 C) 99.1 F (37.3 C) 98.3 F (36.8 C)  TempSrc: Oral Oral Oral Oral  SpO2: 91% 99% 99% 98%  Weight:      Height:       Gen: Resting in bed, NAD Cardiovascular: RRR, No murmurs rubs or gallops  Abdominal: Nl bowel sounds, non tender MSK:  Left hand - Hand wrapped. Moving all digits , mild edema    Assessment/Plan:  Principal Problem:   Tenosynovitis of left hand Active Problems:   Type 2 diabetes mellitus ([place])   Hypertension   58 y.o. male with a PMHx of type 2 diabetes, hypertension and hyperlipidemia who presented with tenosynovitis of left hand..  Now s/p I&D with palmar/carpal tunnel release and dorsal I&D with arthrotomy.  Likely infectious given abundant purulent material drained during I&D. Still unclear etiology, no known portal of entry, although hyperglycemia likely contributed.  Tenosynovitis of left hand:   - Still afebrile, WBC normal at 9.4, HDS, no signs of sepsis.   - On culture results so far, no organisms seen on Gram stain of wound culture - Blood Cx NGTD - Continue IV vancomycin and cefepime today , will narrow based on further surgical culture  - Postsurgical hand care per Orthopedics - Dilaudid q4hrs prn for severe pain  Type 2 diabetes: Poorly-controlled due to being unable to afford insulin  A1c 9 yesterday. - BG still running high - Up Lantus to 12 units this morning - SSI with meals   Hypertension:  - SBP 150 - 166 overnight , better on morning check  148/94. Will continue to monitor, likely increased from acute pain.  - Continue Lisinopril 10  CODE STATUS: FULL Diet: Carb-modified DVT prophylaxis: Lovenox 40mg  subq daily  Dispo: Anticipated discharge pending clinical status  Tamsen Snider, MD PGY1  [phone]

## 2019-03-11 NOTE — Progress Notes (Signed)
Occupational Therapy Treatment Patient Details Name: Carlos Charles MRN: [id number] DOB: [birth year] Today's Date: 03/11/2019    History of present illness Carlos Charles is a 58 y.o. male with PMHx of type 2 diabetes mellitus and hypertension who presents with left hand pain and swelling.  Patient states he was in his usual state of health until he awoke about a week ago with swelling and pain to the top of his left hand.  This progressively worsened and he also developed redness. Pt underwent I & D of l hand   OT comments  Pt s/p I & D of l hand. Pt currently with functional limitations due to the deficits listed below (see OT Problem List). Pt at this time presented in bed and completed bed mobility with HOB elvated and mod I. Pt educated about edema control/pain mangement.  Pt completed LE dressing with min assist, ambulation with no AE and sit to stand transfers with no assist. Pt completed light AROM in L shoulder/elbow and digital ROM. Pt will benefit from skilled OT to increase their safety and independence with ADL and functional mobility for ADL to facilitate discharge to venue listed below.     Follow Up Recommendations  Follow surgeon's recommendation for DC plan and follow-up therapies;Outpatient OT    Equipment Recommendations  None recommended by OT    Recommendations for Other Services      Precautions / Restrictions Precautions Precautions: Other (comment) Required Braces or Orthoses: Splint/Cast Splint/Cast: L forearm Restrictions Weight Bearing Restrictions: No       Mobility Bed Mobility Overal bed mobility: Modified Independent                Transfers Overall transfer level: Independent               General transfer comment: no physical assist required, Good balance    Balance Overall balance assessment: No apparent balance deficits (not formally assessed)                                         ADL either performed or  assessed with clinical judgement   ADL Overall ADL's : Needs assistance/impaired Eating/Feeding: Set up;Independent   Grooming: Set up;Supervision/safety;Standing   Upper Body Bathing: Set up   Lower Body Bathing: Minimal assistance   Upper Body Dressing : Set up;Sitting   Lower Body Dressing: Minimal assistance   Toilet Transfer: Independent;Ambulation;Regular Museum/gallery exhibitions officer and Hygiene: Min guard       Functional mobility during ADLs: Independent General ADL Comments: set up - min A required due to L hand/wrist immobilized     Vision   Vision Assessment?: No apparent visual deficits   Perception     Praxis      Cognition Arousal/Alertness: Awake/alert Behavior During Therapy: WFL for tasks assessed/performed Overall Cognitive Status: Within Functional Limits for tasks assessed                                          Exercises Exercises: Hand exercises Hand Exercises Opposition: AROM Other Exercises Other Exercises: pt instructed on ROM of L digits: flexion, extension and opposition Other Exercises: pt edcuated on elevated positioning of l UE for edema mgt   Shoulder Instructions       General Comments  Pertinent Vitals/ Pain       Pain Assessment: Faces Faces Pain Scale: Hurts little more Pain Location: L hand/wrist Pain Descriptors / Indicators: Throbbing;Sore Pain Intervention(s): Limited activity within patient's tolerance;Monitored during session;Ice applied  Home Living                                          Prior Functioning/Environment              Frequency  Min 2X/week        Progress Toward Goals  OT Goals(current goals can now be found in the care plan section)  Progress towards OT goals: Progressing toward goals  Acute Rehab OT Goals Patient Stated Goal: go home today OT Goal Formulation: With patient Time For Goal Achievement: 03/24/19 Potential to  Achieve Goals: Good ADL Goals Pt Will Perform Grooming: with modified independence;standing Pt Will Perform Lower Body Bathing: with min guard assist;with supervision;with set-up;with caregiver independent in assisting Pt Will Perform Lower Body Dressing: with min guard assist;with supervision;with set-up;sit to/from stand;with caregiver independent in assisting Pt Will Perform Toileting - Clothing Manipulation and hygiene: with supervision;with modified independence;sit to/from stand Additional ADL Goal #1: Pt will tolerate gentle ROM/self ROM exercises of L hand/digits at instructed  Plan Discharge plan remains appropriate    Co-evaluation                 AM-PAC OT "6 Clicks" Daily Activity     Outcome Measure   Help from another person eating meals?: None Help from another person taking care of personal grooming?: A Little Help from another person toileting, which includes using toliet, bedpan, or urinal?: A Little Help from another person bathing (including washing, rinsing, drying)?: A Little Help from another person to put on and taking off regular upper body clothing?: A Little Help from another person to put on and taking off regular lower body clothing?: A Little 6 Click Score: 19    End of Session Equipment Utilized During Treatment: Gait belt  OT Visit Diagnosis: Pain Pain - Right/Left: Left Pain - part of body: Arm   Activity Tolerance Patient tolerated treatment well   Patient Left in chair;with call bell/phone within reach   Nurse Communication Other (comment)(line )        Time: 9702-6378 OT Time Calculation (min): 19 min  Charges: OT General Charges $OT Visit: 1 Visit OT Treatments $Self Care/Home Management : 8-22 mins  Alphia Moh OTR/L  Acute Rehab Services  [phone] office number [phone] pager number    Alphia Moh 03/11/2019, 12:49 PM

## 2019-03-11 NOTE — Plan of Care (Signed)
  Problem: Clinical Measurements: Goal: Will remain free from infection Outcome: Progressing   Problem: Coping: Goal: Level of anxiety will decrease Outcome: Progressing   Problem: Pain Managment: Goal: General experience of comfort will improve Outcome: Progressing   Problem: Safety: Goal: Ability to remain free from injury will improve Outcome: Progressing   Problem: Skin Integrity: Goal: Risk for impaired skin integrity will decrease Outcome: Progressing   

## 2019-03-11 NOTE — Plan of Care (Signed)
  Problem: Clinical Measurements: Goal: Ability to maintain clinical measurements within normal limits will improve Outcome: Progressing   Problem: Clinical Measurements: Goal: Will remain free from infection Outcome: Progressing   Problem: Coping: Goal: Level of anxiety will decrease Outcome: Progressing   Problem: Skin Integrity: Goal: Risk for impaired skin integrity will decrease Outcome: Progressing   Problem: Pain Managment: Goal: General experience of comfort will improve Outcome: Progressing

## 2019-03-11 NOTE — Progress Notes (Signed)
   Ortho Hand Progress Note  Subjective: No acute events last night. Had some increased pain last night but it has improved  Objective: Vital signs in last 24 hours: Temp:  [97.7 F (36.5 C)-99.1 F (37.3 C)] 98.3 F (36.8 C) (11/21 0339) Pulse Rate:  [66-84] 66 (11/21 0339) Resp:  [16-18] 18 (11/21 0339) BP: (150-166)/(89-96) 166/96 (11/21 0339) SpO2:  [91 %-99 %] 98 % (11/21 0339)  Intake/Output from previous day: 11/20 0701 - 11/21 0700 In: 1761.1 [P.O.:540; IV Piggyback:1221.1] Out: 900 [Urine:900] Intake/Output this shift: No intake/output data recorded.  Recent Labs    03/09/19 1138 03/10/19 0459 03/11/19 0459  HGB 13.6 13.2 11.8*   Recent Labs    03/10/19 0459 03/11/19 0459  WBC 9.8 9.4  RBC 4.10* 3.74*  HCT 38.6* 35.2*  PLT 276 251   Recent Labs    03/09/19 1138 03/10/19 0459  NA 136 135  K 4.0 4.7  CL 101 98  CO2 24 23  BUN 10 9  CREATININE 0.70 0.56*  GLUCOSE 199* 255*  CALCIUM 9.1 9.2   No results for input(s): LABPT, INR in the last 72 hours.  Aaox3 nad Resp nonlabored RRR LUE: dressings changed. Volar incision c/d/i. Dorsal incision with bloody drainage. Drain pulled.. Intact flex/ex to the digits without significant pain. Moderate swelling the hand and digits. Fingers wwp with bcr. SILT throughout the finger tips   Assessment/Plan: Left hand dorsal and palmar infections s/p I&D with palmar/carpal tunnel release, dorsal I&D with arthrotomy, POD.   - Medicine primary. Appreciate management - Daily dry dressing changes per nursing - Cxs. Pending - Abx per primary. Would recommend ID consult pending cx results - Elevate LUE - Remainder per primary    Avanell Shackleton III 03/11/2019, 7:23 AM  [phone]

## 2019-03-12 LAB — BASIC METABOLIC PANEL
Anion gap: 9 (ref 5–15)
BUN: 9 mg/dL (ref 6–20)
CO2: 26 mmol/L (ref 22–32)
Calcium: 8.7 mg/dL — ABNORMAL LOW (ref 8.9–10.3)
Chloride: 102 mmol/L (ref 98–111)
Creatinine, Ser: 0.67 mg/dL (ref 0.61–1.24)
GFR calc Af Amer: >60 mL/min (ref >60)
GFR calc non Af Amer: >60 mL/min (ref >60)
Glucose, Bld: 117 mg/dL — ABNORMAL HIGH (ref 70–99)
Potassium: 3.6 mmol/L (ref 3.5–5.1)
Sodium: 137 mmol/L (ref 135–145)

## 2019-03-12 LAB — GLUCOSE, CAPILLARY
Glucose-Capillary: 114 mg/dL — ABNORMAL HIGH (ref 70–99)
Glucose-Capillary: 210 mg/dL — ABNORMAL HIGH (ref 70–99)
Glucose-Capillary: 132 mg/dL — ABNORMAL HIGH (ref 70–99)
Glucose-Capillary: 249 mg/dL — ABNORMAL HIGH (ref 70–99)

## 2019-03-12 LAB — CBC
HCT: 34.7 % — ABNORMAL LOW (ref 39.0–52.0)
Hemoglobin: 11.5 g/dL — ABNORMAL LOW (ref 13.0–17.0)
MCH: 31.9 pg (ref 26.0–34.0)
MCHC: 33.1 g/dL (ref 30.0–36.0)
MCV: 96.1 fL (ref 80.0–100.0)
Platelets: 268 10*3/uL (ref 150–400)
RBC: 3.61 MIL/uL — ABNORMAL LOW (ref 4.22–5.81)
RDW: 12.1 % (ref 11.5–15.5)
WBC: 8.5 10*3/uL (ref 4.0–10.5)
nRBC: 0 % (ref 0.0–0.2)

## 2019-03-12 MED ORDER — IBUPROFEN 200 MG PO TABS
400.0000 mg | ORAL_TABLET | Freq: Four times a day (QID) | ORAL | Status: DC
  Administered 2019-03-12 – 2019-03-13 (×2): 400 mg via ORAL
  Filled 2019-03-12 (×3): qty 2

## 2019-03-12 MED ORDER — HYDROMORPHONE HCL 2 MG PO TABS
2.0000 mg | ORAL_TABLET | ORAL | Status: DC | PRN
  Administered 2019-03-12 – 2019-03-13 (×4): 2 mg via ORAL
  Filled 2019-03-12 (×4): qty 1

## 2019-03-12 MED ORDER — IBUPROFEN 100 MG/5ML PO SUSP
400.0000 mg | Freq: Four times a day (QID) | ORAL | Status: DC
  Administered 2019-03-12: 400 mg via ORAL
  Filled 2019-03-12 (×4): qty 20

## 2019-03-12 NOTE — Plan of Care (Signed)
  Problem: Activity: Goal: Risk for activity intolerance will decrease Outcome: Progressing   Problem: Coping: Goal: Level of anxiety will decrease Outcome: Progressing   Problem: Pain Managment: Goal: General experience of comfort will improve Outcome: Progressing   Problem: Safety: Goal: Ability to remain free from injury will improve Outcome: Progressing   

## 2019-03-12 NOTE — Progress Notes (Signed)
   Subjective:  No acute events overnight.  Feels like his hand pain is slightly worse this morning.  He is on prn Toradol for mild-moderate pain and Dilaudid for severe pain.  Swelling and restricted movement are still improved.  He saw OT yesterday who recommended outpatient OT.  Objective:  Vital signs in last 24 hours: Vitals:   03/11/19 1259 03/11/19 2017 03/12/19 0353 03/12/19 0725  BP: (!) 159/78 (!) 155/78 (!) 165/93 (!) 165/93  Pulse: 73 83 66 66  Resp: 16 18 18 16   Temp: 98.6 F (37 C) 98.7 F (37.1 C) 98.3 F (36.8 C) 98.3 F (36.8 C)  TempSrc: Oral Oral Oral Oral  SpO2: 96% 99% 99% 100%  Weight:      Height:       Weight change:   Intake/Output Summary (Last 24 hours) at 03/12/2019 1243 Last data filed at 03/12/2019 0915 Gross per 24 hour  Intake 1692.52 ml  Output -  Net 1692.52 ml    General: awake, alert, lying comfortably in bed in NAD Pulm: lungs clear to auscultation bilaterally, normal work of breathing on room air CV: regular rate and rhythm, no murmurs, rubs or gallups MSK: left hand wrapped, moving all digits, mild edema in fingers Neuro: A&Ox3; no focal deficits Skin: warm and dry  Psych: normal mood and affect     Assessment/Plan:  Principal Problem:   Tenosynovitis of left hand Active Problems:   Type 2 diabetes mellitus (H[facility])   Hypertension   58 y.o. male with a PMHx of type 2 diabetes, hypertension and hyperlipidemia who presented with tenosynovitis of left hand.  Now s/p I&D with palmar/carpal tunnel release and dorsal I&D with arthrotomy.  Likely infectious given abundant purulent material drained during I&D. Stillunclear etiology, no known portal of entry, although hyperglycemia likely contributed.  Tenosynovitis of left hand: - Still afebrile, WBC normal at 8.5, HDS, no signs of sepsis. - On culture results so far, no organisms seen on Gram stain of wound culture - Blood Cx NGTD - Continue IV vancomycin and cefepime today,  will narrow based on further surgical culture  - If there is no growth by tomorrow morning will consult ID for further guidance on antibiotic choice - Postsurgical hand care per Orthopedics - For increased pain, will switch from IV ketorolac prn to scheduled po ibuprofen 400mg  q6hrs, to help limit need for opioids.  Dilaudid q4hrs prn for breakthrough of severe pain.  Continue acetaminophen 1000mg  q6hrs (max 4000mg /day)  Type 2 diabetes:Poorly-controlled due to being unable to afford insulin A1c 9 on 11/19 - Fasting BG improved overnight, 114-117 - Continue Lantus 12 units qAM - SSI with meals  Hypertension: - Continue Lisinopril 10mg   CODE STATUS:FULL Diet:Carb-modified DVT prophylaxis:Lovenox 40mg  subq daily  Dispo: Anticipated discharge pending clinical status   LOS: 3 days   Tonia Ghent, Medical Student 03/12/2019, 12:43 PM

## 2019-03-13 ENCOUNTER — Encounter (HOSPITAL_COMMUNITY): Payer: Self-pay | Admitting: *Deleted

## 2019-03-13 LAB — CBC
HCT: 33.6 % — ABNORMAL LOW (ref 39.0–52.0)
Hemoglobin: 11.4 g/dL — ABNORMAL LOW (ref 13.0–17.0)
MCH: 31.9 pg (ref 26.0–34.0)
MCHC: 33.9 g/dL (ref 30.0–36.0)
MCV: 94.1 fL (ref 80.0–100.0)
Platelets: 266 10*3/uL (ref 150–400)
RBC: 3.57 MIL/uL — ABNORMAL LOW (ref 4.22–5.81)
RDW: 11.8 % (ref 11.5–15.5)
WBC: 7.5 10*3/uL (ref 4.0–10.5)
nRBC: 0 % (ref 0.0–0.2)

## 2019-03-13 LAB — BASIC METABOLIC PANEL
Anion gap: 7 (ref 5–15)
BUN: 8 mg/dL (ref 6–20)
CO2: 28 mmol/L (ref 22–32)
Calcium: 8.7 mg/dL — ABNORMAL LOW (ref 8.9–10.3)
Chloride: 102 mmol/L (ref 98–111)
Creatinine, Ser: 0.72 mg/dL (ref 0.61–1.24)
GFR calc Af Amer: >60 mL/min (ref >60)
GFR calc non Af Amer: >60 mL/min (ref >60)
Glucose, Bld: 129 mg/dL — ABNORMAL HIGH (ref 70–99)
Potassium: 3.7 mmol/L (ref 3.5–5.1)
Sodium: 137 mmol/L (ref 135–145)

## 2019-03-13 LAB — GLUCOSE, CAPILLARY
Glucose-Capillary: 120 mg/dL — ABNORMAL HIGH (ref 70–99)
Glucose-Capillary: 159 mg/dL — ABNORMAL HIGH (ref 70–99)
Glucose-Capillary: 233 mg/dL — ABNORMAL HIGH (ref 70–99)

## 2019-03-13 MED ORDER — INSULIN ASPART 100 UNIT/ML ~~LOC~~ SOLN: 4.0000 [IU] | Freq: Three times a day (TID) | SUBCUTANEOUS | 0 refills | Status: DC

## 2019-03-13 MED ORDER — ACETAMINOPHEN 500 MG PO TABS: 1000.0000 mg | ORAL_TABLET | Freq: Four times a day (QID) | ORAL | 0 refills | Status: DC

## 2019-03-13 MED ORDER — CEFDINIR 300 MG PO CAPS: 300.0000 mg | ORAL_CAPSULE | Freq: Two times a day (BID) | ORAL | 0 refills | Status: AC

## 2019-03-13 MED ORDER — INSULIN GLARGINE 100 UNIT/ML ~~LOC~~ SOLN: 12.0000 [IU] | Freq: Every day | SUBCUTANEOUS | 2 refills | Status: DC

## 2019-03-13 MED ORDER — BLOOD GLUCOSE MONITOR KIT: PACK | 0 refills | Status: DC

## 2019-03-13 MED ORDER — INSULIN ASPART 100 UNIT/ML ~~LOC~~ SOLN: 0.0000 [IU] | Freq: Three times a day (TID) | SUBCUTANEOUS | 0 refills | Status: DC

## 2019-03-13 MED ORDER — IBUPROFEN 400 MG PO TABS: 400.0000 mg | ORAL_TABLET | Freq: Four times a day (QID) | ORAL | 0 refills | Status: DC

## 2019-03-13 MED ORDER — HYDROMORPHONE HCL 2 MG PO TABS: 2.0000 mg | ORAL_TABLET | Freq: Two times a day (BID) | ORAL | Status: DC | PRN

## 2019-03-13 MED ORDER — SODIUM CHLORIDE 0.9 % IV SOLN
INTRAVENOUS | Status: DC | PRN
  Administered 2019-03-13: 250 mL via INTRAVENOUS

## 2019-03-13 MED ORDER — ORITAVANCIN DIPHOSPHATE 400 MG IV SOLR
1200.0000 mg | Freq: Once | INTRAVENOUS | Status: AC
  Administered 2019-03-13: 1200 mg via INTRAVENOUS
  Filled 2019-03-13: qty 120

## 2019-03-13 NOTE — Progress Notes (Signed)
Lossie Faes to be D/C'd home per MD order.  Discussed prescriptions and follow up appointments with the patient. Prescriptions given to patient and medications delivered from transition of care pharmacy, medication list explained in detail. Pt verbalized understanding.  Allergies as of 03/13/2019   No Known Allergies     Medication List    TAKE these medications   acetaminophen 500 MG tablet Commonly known as: TYLENOL Take 2 tablets (1,000 mg total) by mouth every 6 (six) hours.   blood glucose meter kit and supplies Kit Dispense based on patient and insurance preference. Use up to four times daily as directed. (FOR ICD-9 250.00, 250.01).   cefdinir 300 MG capsule Commonly known as: OMNICEF Take 1 capsule (300 mg total) by mouth 2 (two) times daily.   ibuprofen 400 MG tablet Commonly known as: ADVIL Take 1 tablet (400 mg total) by mouth every 6 (six) hours.   insulin glargine 100 UNIT/ML injection Commonly known as: LANTUS Inject 0.12 mLs (12 Units total) into the skin daily. Start taking on: March 14, 2019   lisinopril 10 MG tablet Commonly known as: ZESTRIL Take 10 mg by mouth daily.   metFORMIN 850 MG tablet Commonly known as: GLUCOPHAGE Take 850 mg by mouth 2 (two) times daily with a meal.       Vitals:   03/13/19 0409 03/13/19 0826  BP: (!) 143/75 (!) 158/84  Pulse: (!) 58 64  Resp:  18  Temp: 98 F (36.7 C) 98.2 F (36.8 C)  SpO2: 100% 98%    IV catheter discontinued intact. Site without signs and symptoms of complications. Dressing and pressure applied. Pt denies pain at this time. No complaints noted.  An After Visit Summary was printed and given to the patient. Patient escorted via [place], and D/C home via private auto.  [place] 03/13/2019 5:33 PM

## 2019-03-13 NOTE — TOC Progression Note (Signed)
Transition of Care [facility]) - Progression Note    Patient Details  Name: Alik Mawson MRN: [id number] Date of Birth: [birth year]  [facility]) CM/SW Contact  Bartholomew Crews, RN Phone Number: [phone] 03/13/2019, 1:06 PM  Clinical Narrative:    Received message from MD with questions about discharge medications. Match created, and requested that discharge medications be sent to [place].    Expected Discharge Plan: Home/Self Care Barriers to Discharge: Continued Medical Work up  Expected Discharge Plan and Services Expected Discharge Plan: Home/Self Care   Discharge Planning Services: [place] [facility]   Living arrangements for the past 2 months: Apartment                 DME Arranged: N/A DME Agency: NA       HH Arranged: NA HH Agency: NA         Social Determinants of Health (SDOH) Interventions    Readmission Risk Interventions No flowsheet data found.

## 2019-03-13 NOTE — Discharge Instructions (Signed)
[name],  It was a pleasure to take part in your care. Below are instruction on doctors you need to follow-up with and what you were treated for. See this paperwork for your appointment times with Surgery and Infectious Disease. The front desk will call to set up your primary care appointment in the Internal Medicine Clinic    You were treated for the following:  Hand infection, also called tenosynovitis You received broad-spectrum antibiotics while we waited for the cultures to grow.  Cultures did not show a specific organism.  Infectious disease team started the following treatment:  1. Oritavancin x 1 today (3 hour infusion which we informed him of) 2. Dalbavancin 500 mg 1/5 doses of scheduled for next Wednesday AM (we have called short stay).  3. Cefdinir BID x 42 days to be given prior to discharge from [facility].  4. Follow up appt 04/10/19 with Dr. Baxter Flattery   Type 2 Diabetes Mellitus  Your Hgb A1c was 9 on admission and this tells [place] you need Insulin to better control your blood sugar at this time.  - Lantus 12 units nightly and continue your Metformin.  Discharge Instructions  - Continue daily dressing changes. Please keep the incision clean and dry - Take all medication as prescribed. Transition to over the counter pain medication as your pain improves - Keep the hand elevated over the next 48-72 hours to help with pain and swelling - Move all digits not restricted by the dressings regularly to prevent stiffness - Please call to schedule a follow up appointment with Dr. Jeannie Fend  at [phone] for 7-10 days following discharge - Your pain medication have been send digitally to your pharmacy

## 2019-03-13 NOTE — Progress Notes (Signed)
   Ortho Hand Progress Note  Subjective: No acute events last night. Notes continued improvement in his pain and swelling  Objective: Vital signs in last 24 hours: Temp:  [97.4 F (36.3 C)-98.3 F (36.8 C)] 98.2 F (36.8 C) (11/23 0826) Pulse Rate:  [58-70] 64 (11/23 0826) Resp:  [16-18] 18 (11/23 0826) BP: (143-169)/(75-87) 158/84 (11/23 0826) SpO2:  [98 %-100 %] 98 % (11/23 0826)  Intake/Output from previous day: 11/22 0701 - 11/23 0700 In: 2139.2 [P.O.:1200; I.V.:239.2; IV Piggyback:700.1] Out: -  Intake/Output this shift: No intake/output data recorded.  Recent Labs    03/11/19 0459 03/12/19 0449 03/13/19 0352  HGB 11.8* 11.5* 11.4*   Recent Labs    03/12/19 0449 03/13/19 0352  WBC 8.5 7.5  RBC 3.61* 3.57*  HCT 34.7* 33.6*  PLT 268 266   Recent Labs    03/12/19 0449 03/13/19 0352  NA 137 137  K 3.6 3.7  CL 102 102  CO2 26 28  BUN 9 8  CREATININE 0.67 0.72  GLUCOSE 117* 129*  CALCIUM 8.7* 8.7*   No results for input(s): LABPT, INR in the last 72 hours.  Aaox3 nad Resp nonlabored RRR LUE: dressings changed. Volar and dorsal incisions c/d/i. Intact flex/ex to the digits without significant pain. Decreased swelling the hand and digits. Fingers wwp with bcr. SILT throughout the finger tips   Assessment/Plan: Left hand dorsal and palmar infections s/p I&D with palmar/carpal tunnel release, dorsal I&D with arthrotomy, POD4.   - Medicine primary. Appreciate management - Daily dry dressing changes per nursing - Cxs. NGTD - Abx per primary.  - Elevate LUE - Remainder per primary   OK for d/c from hand standpoint. Continue daily dressing changes. Follow up with me in 1 week   Avanell Shackleton III 03/13/2019, 9:59 AM  [phone]

## 2019-03-13 NOTE — Progress Notes (Signed)
   Subjective:  No acute events overnight.  Still having pain in his hand but he thinks the Tylenol is helping.  Still able to move his fingers.  No CP, SOB or other complaints today.  Objective:  Vital signs in last 24 hours: Vitals:   03/12/19 1451 03/12/19 1941 03/13/19 0409 03/13/19 0826  BP: (!) 169/87 (!) 146/83 (!) 143/75 (!) 158/84  Pulse: 68 70 (!) 58 64  Resp: 16 16  18   Temp: (!) 97.4 F (36.3 C) 98.3 F (36.8 C) 98 F (36.7 C) 98.2 F (36.8 C)  TempSrc: Oral Oral Oral Oral  SpO2: 100% 100% 100% 98%  Weight:      Height:       Weight change:   Intake/Output Summary (Last 24 hours) at 03/13/2019 3532 Last data filed at 03/13/2019 9924 Gross per 24 hour  Intake 1899.23 ml  Output -  Net 1899.23 ml    General: awake, alert, lying comfortably in bed in NAD Pulm: lungs clear to auscultation bilaterally, normal work of breathing on room air CV: regular rate and rhythm, no murmurs, rubs or gallups MSK: left hand wrapped, moving all digits, mild edema in fingers Neuro: A&Ox3; no focal deficits Skin: warm and dry Psych: normal mood and affect   Assessment/Plan:  Principal Problem:   Tenosynovitis of left hand Active Problems:   Type 2 diabetes mellitus ([place])   Hypertension   58 y.o.male with a PMHx of type 2 diabetes, hypertension and hyperlipidemia who presented with tenosynovitis of left hand.  Now s/p I&D with palmar/carpal tunnel release and dorsal I&D with arthrotomy. Likely infectious given abundant purulent material drained during I&D. Stillunclear etiology, no known portal of entry, although hyperglycemia likely contributed.  Tenosynovitis of left hand: -Still afebrile, WBC normal at 7.5, HDS, no signs of sepsis. - Still NGTD on surgical culture, now at >3 days -ContinueIV vancomycin and cefepime for now, but consult ID today for further guidance on antibiotic choice - Willnarrow antibiotics based on ID recommendations (appreciate their  guidance) - Postsurgical hand care per Orthopedics - For pain will continue scheduled ibuprofen and Tylenol.  Will try to wean off Dilaudid today.  Give Dilaudid only prn q12hrs if he has severe breakthrough pain, but otherwise hold.  Type 2 diabetes:Recently poorly-controlled due to being unable to afford insulin. A1c 9 on 11/19 - Fasting BGadequately controlled overnight at 129 -ContinueLantus 12units qAM - SSI with meals - Case Manager saw patient on 11/20 for medication assistance, and assisted him with completing patient assistance for lantus medication through his Internal Medicine provider - Follow-up in Internal Medicine clinic; can receive additional assistance from pharmacist there for medication assistance with his insulin  Hypertension: -ContinueLisinopril10mg  daily  CODE STATUS:FULL Diet:Carb-modified DVT prophylaxis:Lovenox 40mg  subq daily   Dispo: Anticipated discharge today or tomorrow pending ID recommendations   LOS: 4 days   Tonia Ghent, Medical Student 03/13/2019, 9:48 AM

## 2019-03-13 NOTE — Consult Note (Signed)
[facility]    Date of Admission:  03/09/2019     Total days of antibiotics 4                Reason for Consult: Hand infection     Referring Provider: Mikey Bussing Primary Care Provider: Burna Cash, MD    Assessment: Carlos Charles is a 58 y.o. male with uncontrolled diabetes now POD 4 following debridement of deep infection involving the anterior and posterior hand. Unfortunately all cultures taken from the OR are no growth at day 4. Will follow until they are final but will prepare for treatment presuming they will remain negative. We discussed treatment options and he declined PICC line and home IV antibiotics despite this being our first choice for this situation. We will plan on doing weekly long-acting injections through short stay for gram positive organism coverage and start cefdinir BID for gram negative coverage. (Avoided quinolone with current problem being tenosynovitis). Will plan for 6 weeks of treatment for most aggressive measure to preserve function of the wrist/hand and cure this infection for him. Will see him back in ID clinic at 4 week mark on Monday 12/21 with Dr. Drue Second.    Plan: 1. Oritavancin x 1 today (3 hour infusion which we informed him of) 2. Dalbavancin 500 mg 1/5 doses of scheduled for next Wednesday AM (we have called short stay).  3. Cefdinir BID x 42 days to be given prior to discharge from [facility] pharmacy.  4. Follow up appt 04/10/19 with Dr. Drue Second    Principal Problem:   Tenosynovitis of left hand Active Problems:   Type 2 diabetes mellitus (H[facility])   Hypertension   . acetaminophen  1,000 mg Oral Q6H  . chlorhexidine  60 mL Topical Once  . enoxaparin (LOVENOX) injection  40 mg Subcutaneous Q24H  . ibuprofen  400 mg Oral Q6H  . insulin aspart  0-15 Units Subcutaneous TID WC  . insulin aspart  0-5 Units Subcutaneous QHS  . insulin aspart  4 Units Subcutaneous TID WC  . insulin glargine  12 Units  Subcutaneous Daily  . lisinopril  10 mg Oral Daily  . povidone-iodine  2 application Topical Once  . senna-docusate  2 tablet Oral BID    HPI: Carlos Charles is a 58 y.o. male with uncontrolled diabetes, hypertension and hyperlipemia.   He presented to the hospital with swelling and pain of the left wrist that started 2 weeks ago and worsened. He initially came to the ER 03/01/19 where he was given prednisone and naproxen to treat for presumed gout. He denied any fevers, night sweats or chills during this 2 week period but had increased erythema over the wrist and pain extending into his forearm. He saw his PCP in follow up after the pain did not improve with these measures and was given a dose of Toradol and sent to ER for evaluation with concern over hand infection.   In the ER he was afebrile and w/o leukocytosis. Hemodynamics were stable. CT scan was obtained in the ER and revealed dorsal and palmar rim-enhancing fluid collections along the flexor tendons of the fourth and fifth digit and within the carpal tunnel. Operative note from Dr. Roney Mans was reviewed and he noted multiple areas of abundant purulent material about the flexor and extensor tendons of the dorsal hand, as well as the radiocarpal joint; volar incision also revealed purulent material in the carpal tunnel as well that spared  the flexor tendons.   Two intraoperative cultures were taken and have not yielded any bacterial growth, 4 days.   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 He is uninsured currently. He does not use insulin regularly to control his diabetes.   Sed Rate (mm/hr)  Date Value  03/09/2019 77 (H)   CRP (mg/dL)  Date Value  40/98/119111/19/2020 8.3 (H)     Review of Systems: Review of Systems  Constitutional: Negative for chills, fever, malaise/fatigue and weight loss.  HENT: Negative for sore throat.        No dental problems  Respiratory: Negative for cough and  sputum production.   Cardiovascular: Negative for chest pain and leg swelling.  Gastrointestinal: Negative for abdominal pain, diarrhea and vomiting.  Genitourinary: Negative for dysuria and flank pain.  Musculoskeletal: Negative for joint pain, myalgias and neck pain.  Skin: Negative for rash.  Neurological: Negative for dizziness, tingling and headaches.  Psychiatric/Behavioral: Negative for depression and substance abuse. The patient is not nervous/anxious and does not have insomnia.     Past Medical History:  Diagnosis Date  . Diabetes mellitus without complication (HCC)   . Essential hypertension   . Hyperlipidemia   . Osteoarthritis of L hip     Social History   Tobacco Use  . Smoking status: Current Every Day Smoker    Packs/day: 0.30  . Smokeless tobacco: Never Used  . Tobacco comment: 1/3 pack per day   Substance Use Topics  . Alcohol use: Yes  . Drug use: Not on file    Family History  Problem Relation Age of Onset  . Diabetes Mother   . Healthy Father   . Diabetes Sister    No Known Allergies  OBJECTIVE: Blood pressure (!) 158/84, pulse 64, temperature 98.2 F (36.8 C), temperature source Oral, resp. rate 18, height 5\' 11"  (1.803 m), weight 78.5 kg, SpO2 98 %.  Physical Exam Vitals signs and nursing note reviewed.  Constitutional:      Appearance: He is well-developed.     Comments:    HENT:     Mouth/Throat:     Dentition: Normal dentition. No dental abscesses.  Cardiovascular:     Rate and Rhythm: Normal rate and regular rhythm.     Heart sounds: Normal heart sounds.  Pulmonary:     Effort: Pulmonary effort is normal.     Breath sounds: Normal breath sounds.  Abdominal:     General: There is no distension.     Palpations: Abdomen is soft.     Tenderness: There is no abdominal tenderness.  Musculoskeletal:     Comments: L wrist wrapped in operative dressing that is clean and dry.   Lymphadenopathy:     Cervical: No cervical adenopathy.   Skin:    General: Skin is warm and dry.     Findings: No rash.  Neurological:     Mental Status: He is alert and oriented to person, place, and time.  Psychiatric:        Judgment: Judgment normal.     Lab Results Lab Results  Component Value Date   WBC 7.5 03/13/2019   HGB 11.4 (L) 03/13/2019   HCT 33.6 (L) 03/13/2019   MCV 94.1 03/13/2019   PLT 266 03/13/2019    Lab Results  Component Value Date   CREATININE 0.72 03/13/2019   BUN 8 03/13/2019   NA 137 03/13/2019   K 3.7 03/13/2019   CL 102 03/13/2019  CO2 28 03/13/2019   No results found for: ALT, AST, GGT, ALKPHOS, BILITOT   Microbiology: Recent Results (from the past 240 hour(s))  Culture, blood (routine x 2)     Status: None (Preliminary result)   Collection Time: 03/09/19 12:06 PM   Specimen: BLOOD  Result Value Ref Range Status   Specimen Description BLOOD RIGHT ANTECUBITAL  Final   Special Requests   Final    BOTTLES DRAWN AEROBIC AND ANAEROBIC Blood Culture adequate volume   Culture   Final    NO GROWTH 4 DAYS Performed at [facility], [address] [place], [place] [address]    Report Status PENDING  Incomplete  Culture, blood (routine x 2)     Status: None (Preliminary result)   Collection Time: 03/09/19  1:05 PM   Specimen: BLOOD RIGHT HAND  Result Value Ref Range Status   Specimen Description BLOOD RIGHT HAND  Final   Special Requests   Final    BOTTLES DRAWN AEROBIC AND ANAEROBIC Blood Culture results may not be optimal due to an inadequate volume of blood received in culture bottles   Culture   Final    NO GROWTH 4 DAYS Performed at [facility], [place] [address], [place], [place] [address]    Report Status PENDING  Incomplete  SARS Coronavirus 2 by RT PCR (hospital order, performed in [facility] hospital lab) Nasopharyngeal Nasopharyngeal Swab     Status: None   Collection Time: 03/09/19  3:55 PM   Specimen: Nasopharyngeal Swab  Result Value Ref Range Status   SARS Coronavirus  2 NEGATIVE NEGATIVE Final    Comment: (NOTE) If result is NEGATIVE SARS-CoV-2 target nucleic acids are NOT DETECTED. The SARS-CoV-2 RNA is generally detectable in upper and lower  respiratory specimens during the acute phase of infection. The lowest  concentration of SARS-CoV-2 viral copies this assay can detect is 250  copies / mL. A negative result does not preclude SARS-CoV-2 infection  and should not be used as the sole basis for treatment or other  patient management decisions.  A negative result may occur with  improper specimen collection / handling, submission of specimen other  than nasopharyngeal swab, presence of viral mutation(s) within the  areas targeted by this assay, and inadequate number of viral copies  (<250 copies / mL). A negative result must be combined with clinical  observations, patient history, and epidemiological information. If result is POSITIVE SARS-CoV-2 target nucleic acids are DETECTED. The SARS-CoV-2 RNA is generally detectable in upper and lower  respiratory specimens dur ing the acute phase of infection.  Positive  results are indicative of active infection with SARS-CoV-2.  Clinical  correlation with patient history and other diagnostic information is  necessary to determine patient infection status.  Positive results do  not rule out bacterial infection or co-infection with other viruses. If result is PRESUMPTIVE POSTIVE SARS-CoV-2 nucleic acids MAY BE PRESENT.   A presumptive positive result was obtained on the submitted specimen  and confirmed on repeat testing.  While 2019 novel coronavirus  (SARS-CoV-2) nucleic acids may be present in the submitted sample  additional confirmatory testing may be necessary for epidemiological  and / or clinical management purposes  to differentiate between  SARS-CoV-2 and other Sarbecovirus currently known to infect humans.  If clinically indicated additional testing with an alternate test  methodology  [phone]) is advised. The SARS-CoV-2 RNA is generally  detectable in upper and lower respiratory sp ecimens during the acute  phase of infection. The expected result is Negative. Fact Sheet for Patients:  BoilerBrush.com.cy Fact Sheet for Healthcare Providers: https://pope.com/ This test is not yet approved or cleared by the [place] FDA and has been authorized for detection and/or diagnosis of SARS-CoV-2 by FDA under an Emergency Use Authorization (EUA).  This EUA will remain in effect (meaning this test can be used) for the duration of the COVID-19 declaration under Section 564(b)(1) of the Act, 21 U.S.C. section 360bbb-3(b)(1), unless the authorization is terminated or revoked sooner. Performed at [facility], [address] [place], [place] [address]   Aerobic/Anaerobic Culture (surgical/deep wound)     Status: None (Preliminary result)   Collection Time: 03/09/19  8:16 PM   Specimen: Soft Tissue, Other  Result Value Ref Range Status   Specimen Description WOUND LEFT HAND  Final   Special Requests TRIDORSAL SAMPLE A  Final   Gram Stain   Final    ABUNDANT WBC PRESENT, PREDOMINANTLY PMN NO ORGANISMS SEEN    Culture   Final    NO GROWTH 4 DAYS NO ANAEROBES ISOLATED; CULTURE IN PROGRESS FOR 5 DAYS Performed at [facility], [address], [place], [place] [address]    Report Status PENDING  Incomplete  Aerobic/Anaerobic Culture (surgical/deep wound)     Status: None (Preliminary result)   Collection Time: 03/09/19  8:34 PM   Specimen: Soft Tissue, Other  Result Value Ref Range Status   Specimen Description WOUND  Final   Special Requests LEFT PALM SAMPLE B  Final   Gram Stain   Final    ABUNDANT WBC PRESENT, PREDOMINANTLY PMN NO ORGANISMS SEEN    Culture   Final    NO GROWTH 4 DAYS NO ANAEROBES ISOLATED; CULTURE IN PROGRESS FOR 5 DAYS Performed at [facility], [address] [place], [place]  [address]    Report Status PENDING  Incomplete    Rexene Alberts, MSN, NP-C [facility] Cell: [phone] Pager: [phone]  03/13/2019 11:55 AM

## 2019-03-14 LAB — AEROBIC/ANAEROBIC CULTURE W GRAM STAIN (SURGICAL/DEEP WOUND)
Culture: NO GROWTH
Culture: NO GROWTH

## 2019-03-14 LAB — CULTURE, BLOOD (ROUTINE X 2)
Culture: NO GROWTH
Culture: NO GROWTH
Special Requests: ADEQUATE

## 2019-03-16 NOTE — Discharge Summary (Signed)
Name: Carlos Charles MRN: [id number] DOB: May 18, 1960 58 y.o. PCP: Welford Roche, MD  Date of Admission: 03/09/2019 10:19 AM Date of Discharge: 03/13/2019 Attending Physician: Joni Reining C  Discharge Diagnosis: 1. Tenosynovitis of left hand  Discharge Medications: Allergies as of 03/13/2019   No Known Allergies     Medication List    TAKE these medications   acetaminophen 500 MG tablet Commonly known as: TYLENOL Take 2 tablets (1,000 mg total) by mouth every 6 (six) hours.   blood glucose meter kit and supplies Kit Dispense based on patient and insurance preference. Use up to four times daily as directed. (FOR ICD-9 250.00, 250.01).   cefdinir 300 MG capsule Commonly known as: OMNICEF Take 1 capsule (300 mg total) by mouth 2 (two) times daily.   ibuprofen 400 MG tablet Commonly known as: ADVIL Take 1 tablet (400 mg total) by mouth every 6 (six) hours.   insulin glargine 100 UNIT/ML injection Commonly known as: LANTUS Inject 0.12 mLs (12 Units total) into the skin daily.   lisinopril 10 MG tablet Commonly known as: ZESTRIL Take 10 mg by mouth daily.   metFORMIN 850 MG tablet Commonly known as: GLUCOPHAGE Take 850 mg by mouth 2 (two) times daily with a meal.       Disposition and follow-up:   [name] was discharged from [facility] in Stable condition.  At the hospital follow up visit please address:  #Tenosynivits of Left Hand - cultures NG Patient has follow up with ID and Ortho Surgery. Plan for antibiotics as following:  1. Oritavancin x 1 today (3 hour infusion which we informed him of) 2. Dalbavancin 500 mg 1/5 doses of scheduled for next Wednesday AM (we have called short stay).  3. Cefdinir BID x 42 days to be given prior to discharge from [facility].   #Diabetes Mellitus  - A1C 9 on 11/19. - Sent out on Lantus 12 units with financial assistance. Patient reports work has slowed down and he wound benefit from  any resources available through Clinic to provide Lantus. - Continue Metformin  - Review home blood glucose monitoring  HTN SBP 145-168, continued Lisinopril 10 mg .  - No adjustments made in setting of pain from infection, may need additional anti-hypertensive medication.    2.  Labs / imaging needed at time of follow-up:   3.  Pending labs/ test needing follow-up:   Follow-up Appointments: Follow-up Information    Avanell Shackleton III, MD. Schedule an appointment as soon as possible for a visit in 1 week(s).   Contact information: [address] [place] [address] [phone]        Carlyle Basques, MD Follow up on 04/10/2019.   Specialty: Infectious Diseases Why: Follow up scheduled for 10:00 am  Contact information: [address] [place] [address] [phone]        Welford Roche, MD Follow up in 1 week(s).   Specialty: Internal Medicine Why: Clinic will call you to make an appointment.  Contact information: [place] [address] [address] [facility] Course by problem list: 1. Tenosynovitis of left hand: Patient presented to [facility] clinic as a new patient with left hand swelling.  Seen the prior day at a urgent care and given naproxen and prednisone.  Patient was sent to the ED where CT confirmed cellulitis and abscess of the hand.  Taken by orthopedic surgery  to the OR for debridement.  Now s/p I&D with palmar/carpal tunnel release and dorsal I&D with arthrotomy.  Likely infectious given abundant purulent material drained during I&D.  Stillunclear etiology, no known portal of entry.  Surgical cultures negative to date.  ID was consulted and gave oritavancin with plans to give Dalbavancin ( arranging as short stay). Outpatient  plus cefdinir x42 days with follow-up in ID clinic.  Also follow-up with orthopedic surgery.  Type 2 diabetes:Recently poorly-controlled due to being unable to afford  insulin. A1c 9on 11/19 On Lantus, titrated up to 12 units and SSI in hospital. -Case Manager saw patient on 11/20 for medication assistance, and assisted him with completing patient assistance for Lantus medication through his Internal Medicine provider. Plan to have patient follow-up in Internal Medicine clinic; can receive additional assistance from pharmacist there for medication assistance with his insulin - Sent out on Lantus 12 Units - Metformin 850 mg BID   Hypertension: -ContinueLisinopril80m daily   Discharge Vitals:   BP (!) 158/84 (BP Location: Right Arm)   Pulse 64   Temp 98.2 F (36.8 C) (Oral)   Resp 18   Ht 5' 11" (1.803 m)   Wt 78.5 kg   SpO2 98%   BMI 24.13 kg/m   Pertinent Labs, Studies, and Procedures:  CBC Latest Ref Rng & Units 03/13/2019 03/12/2019 03/11/2019  WBC 4.0 - 10.5 K/uL 7.5 8.5 9.4  Hemoglobin 13.0 - 17.0 g/dL 11.4(L) 11.5(L) 11.8(L)  Hematocrit 39.0 - 52.0 % 33.6(L) 34.7(L) 35.2(L)  Platelets 150 - 400 K/uL 266 268 251   BMP Latest Ref Rng & Units 03/13/2019 03/12/2019 03/10/2019  Glucose 70 - 99 mg/dL 129(H) 117(H) 255(H)  BUN 6 - 20 mg/dL _0 Creatinine 0.61 - 1.24 mg/dL 0.72 0.67 0.56(L)  Sodium 135 - 145 mmol/L 137 137 135  Potassium 3.5 - 5.1 mmol/L 3.7 3.6 4.7  Chloride 98 - 111 mmol/L 102 102 98  CO2 22 - 32 mmol/L _1 Calcium 8.9 - 10.3 mg/dL 8.7(L) 8.7(L) 9.2   03/09/2019 CT Hand Left W Contrast  FINDINGS: Bones/Joint/Cartilage  There is carpal coalition is noted at the capitate and hamate. There is advanced degenerative changes seen through the carpal/metacarpal joints and the radiocarpal joints with cystic/erosive changes and diffuse sclerosis.  No osseous fracture is noted.  Ligaments  Suboptimally assessed by CT.  Muscles and Tendons  The peripherally enhancing fluid is seen surrounding the flexor digitorum tendons at the level of the metacarpal bases and extending predominantly around the  third and fourth flexor tendons to the level of the metacarpal heads. There is also increased intrasubstance signal with a small amount of fluid seen surrounding the flexor carpi ulnaris tendon. There is also peripherally enhancing fluid seen extending around the extensor digitorum tendons from the metacarpal bases extending distally to the MCP joints, predominantly around the third and fourth digits. The muscles surrounding the hand appear to be intact without evidence of atrophy or focal tear.  Soft tissues  Diffuse dorsal subcutaneous edema and skin thickening is noted.  IMPRESSION: 1. Enhancing fluid around the flexor digitorum tendons as well as the extensor digitorum tendons, predominantly the third and fourth digits, as well as the flexor carpi ulnaris tendon. This could be due to inflammatory or infectious tenosynovitis 2. Diffuse dorsal subcutaneous edema and skin thickening. 3. Findings concerning for inflammatory/erosive arthropathy involving the carpals. 4. 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 These results were called by telephone at  the time of interpretation on 03/09/2019 at 4:14 pm to provider [place], [place], who verbally acknowledged these results  Discharge Instructions: Discharge Instructions    Call MD for:  redness, tenderness, or signs of infection (pain, swelling, redness, odor or green/yellow discharge around incision site)   Complete by: As directed    Call MD for:  temperature >100.4   Complete by: As directed    Diet - low sodium heart healthy   Complete by: As directed    Increase activity slowly   Complete by: As directed       Signed:  Tamsen Snider, MD PGY1  [phone]

## 2019-03-20 ENCOUNTER — Encounter: Payer: Self-pay | Admitting: Internal Medicine

## 2019-03-20 ENCOUNTER — Telehealth: Payer: Self-pay | Admitting: Pharmacy Technician

## 2019-03-20 ENCOUNTER — Ambulatory Visit (INDEPENDENT_AMBULATORY_CARE_PROVIDER_SITE_OTHER): Payer: Self-pay | Admitting: Internal Medicine

## 2019-03-20 ENCOUNTER — Other Ambulatory Visit: Payer: Self-pay

## 2019-03-20 VITALS — BP 148/94 | HR 87 | Temp 98.4°F | Ht 71.0 in | Wt 171.6 lb

## 2019-03-20 DIAGNOSIS — Z794 Long term (current) use of insulin: Secondary | ICD-10-CM

## 2019-03-20 DIAGNOSIS — M659 Synovitis and tenosynovitis, unspecified: Secondary | ICD-10-CM

## 2019-03-20 DIAGNOSIS — E119 Type 2 diabetes mellitus without complications: Secondary | ICD-10-CM

## 2019-03-20 DIAGNOSIS — I10 Essential (primary) hypertension: Secondary | ICD-10-CM

## 2019-03-20 MED ORDER — METFORMIN HCL 1000 MG PO TABS: 850.0000 mg | ORAL_TABLET | Freq: Two times a day (BID) | ORAL | 12 refills | Status: AC

## 2019-03-20 MED FILL — metFORMIN HCL 1000 MG TABS: 1000 | 30 days supply | Qty: 60 | Fill #0

## 2019-03-20 NOTE — Assessment & Plan Note (Signed)
Patient has a history of insulin-dependent T2DM and presents today for follow up. He was discharged on Lantus 12 units and metformin 850 mg BID. He states he has only been using 3 units of Lantus daily, unable to explain why, as well as the metformin. His BGs at home have been between 94 (lowest) and 126 (highest). No symptoms of hypoglycemia. Will discontinue Lantus (as such low dose is likely not helping much with glycemic control) and increase metformin to maximum dose. Advised to continue checking BG 2x per day. Will follow up by phone in 2 weeks.

## 2019-03-20 NOTE — Patient Instructions (Addendum)
Carlos Charles,   For your hand, we scheduled you a follow up appointment with your orthopedic surgeon. Please make sure to follow up with him. Also, continue taking your antibiotics as usual.   You will need more antibiotics through the IV. The short stay unit at the hospital will arrange this. In addition to this, you need to get assistance approval for this antibiotic. You will need to provide a month of paystubs, the most recent one. Bring this paper work to [place] at the [facility] for Infect[facility] Diseases located at   [place] (bottom floor) Phone: [phone]  You do not need an appointment for this. Can stop by any time during the week. They are closed 12:30-1:30pm for lunch and Fridays after noon.   For your diabetes, STOP Lantus. We will increase your metformin to 1000 mg twice daily.   We will give you a call in a 2 weeks to see how your sugars are doing.   - Dr. Frederico Hamman

## 2019-03-20 NOTE — Progress Notes (Signed)
   CC: Hospital follow-up for left hand tenosynovitis, diabetes, and HTN  HPI:  Carlos Charles is a 58 y.o. year-old male with PMH listed below who presents to clinic for hospital follow-up for left hand tenosynovitis, diabetes, and HTN. Please see problem based assessment and plan for further details.   Past Medical History:  Diagnosis Date  . Diabetes mellitus without complication ([facility])   . Essential hypertension   . Hyperlipidemia   . Osteoarthritis of L hip     Review of Systems:   Review of Systems  Constitutional: Negative for chills, fever and malaise/fatigue.  Musculoskeletal: Positive for joint pain and myalgias.    Physical Exam:  Vitals:   03/20/19 1432  BP: (!) 148/94  Pulse: 87  Temp: 98.4 F (36.9 C)  TempSrc: Oral  SpO2: 100%  Weight: 171 lb 9.6 oz (77.8 kg)  Height: 5\' 11"  (1.803 m)    General: Well-appearing male in no acute distress MSK: L hand is wrapped. Dressing appears dry and intact. Able to move all 5 digits with mild pain.     Assessment & Plan:   See Encounters Tab for problem based charting.  Patient discussed with Carlos Charles

## 2019-03-20 NOTE — Assessment & Plan Note (Signed)
Patient was admitted 11/19-11/23 for left hand swelling and was found to have tenosynovitis. No clear source was identified. He underwent incision and drainage on 11/19. Wound cultures were obtained but there was no growth. Blood cultures negative as well. He received a dose of oritavancin prior to discharge with plan to complete 5 doses of dalbavancin in short stay and cefdinir 300 mg BID x 42 days. He reports doingg well since discharge. He is having mild pain in the L hand but overall improving. Able to move all digits. Has not followed up with ortho.   - Called ortho office, patient scheduled for follow up appt tomorrow  - Discussed need to provide paystubs for assistance with dalbavancin cost before he can get infusions. Provided contact information for Ronney Asters [facility] pharmacist) in [place] so he can contact her with this information as soon as possible  - Continue Cefdinir x 42 days (last day 04/24/2119)

## 2019-03-20 NOTE — Telephone Encounter (Addendum)
[facility] Patient Advocate Encounter  Mr. Budnick brought needed information to get assistance approval from Veteran for Carlos Charles.  I have faxed the information in and will follow up for approval.  [name] [name] Patient [facility] for Infectious Disease Phone: [phone] Fax:  [phone]

## 2019-03-22 ENCOUNTER — Telehealth: Payer: Self-pay

## 2019-03-22 ENCOUNTER — Other Ambulatory Visit: Payer: Self-pay

## 2019-03-22 ENCOUNTER — Ambulatory Visit (HOSPITAL_COMMUNITY)
Admission: RE | Admit: 2019-03-22 | Discharge: 2019-03-22 | Disposition: A | Discharge status: Home / Self Care | Payer: Self-pay | Source: Ambulatory Visit | Attending: Internal Medicine | Admitting: Internal Medicine

## 2019-03-22 DIAGNOSIS — M65842 Other synovitis and tenosynovitis, left hand: Secondary | ICD-10-CM | POA: Insufficient documentation

## 2019-03-22 MED ORDER — DEXTROSE 5 % IV SOLN
500.0000 mg | Freq: Once | INTRAVENOUS | Status: AC
  Administered 2019-03-22: 500 mg via INTRAVENOUS
  Filled 2019-03-22: qty 25

## 2019-03-22 NOTE — Progress Notes (Signed)
Internal Medicine Clinic Attending  Case discussed with Dr. Santos-Sanchez at the time of the visit.  We reviewed the resident's history and exam and pertinent patient test results.  I agree with the assessment, diagnosis, and plan of care documented in the resident's note.    

## 2019-03-22 NOTE — Telephone Encounter (Signed)
The note is correct - he should receive 5 weekly doses outpatient total, so will need script to extend for 4 more doses.

## 2019-03-22 NOTE — Telephone Encounter (Signed)
Rip Harbour from [facility] called office requesting new orders for antibiotics. States that current order for infusion is for one time dose, but order in last ID note is requesting infusion x5 weeks. New order needs to be faxed to Short Stay at [phone]. Will route message to Janene Madeira, Np to clarify order for Dalbavancin. Will have Dr. Baxter Flattery sign new order. [place]

## 2019-03-28 ENCOUNTER — Other Ambulatory Visit (HOSPITAL_COMMUNITY): Payer: Self-pay | Admitting: *Deleted

## 2019-03-29 ENCOUNTER — Other Ambulatory Visit: Payer: Self-pay

## 2019-03-29 ENCOUNTER — Encounter (HOSPITAL_COMMUNITY)
Admission: RE | Admit: 2019-03-29 | Discharge: 2019-03-29 | Disposition: A | Discharge status: Home / Self Care | Payer: Self-pay | Source: Ambulatory Visit | Attending: Internal Medicine | Admitting: Internal Medicine

## 2019-03-29 DIAGNOSIS — M659 Synovitis and tenosynovitis, unspecified: Secondary | ICD-10-CM | POA: Insufficient documentation

## 2019-03-29 MED ORDER — DEXTROSE 5 % IV SOLN
500.0000 mg | Freq: Once | INTRAVENOUS | Status: AC
  Administered 2019-03-29: 500 mg via INTRAVENOUS
  Filled 2019-03-29: qty 25

## 2019-04-05 ENCOUNTER — Telehealth: Payer: Self-pay

## 2019-04-05 NOTE — Telephone Encounter (Signed)
Patient states he has been feeling nauseated for the past couple of days. He has been drinking Ginger-Ale with little/no relief. Patient states he does not have any money to to pick up any prescriptions and he will try OTC Pepto Bismol. He just wanted to let Dr. Baxter Flattery know just in case he may have to rescheduled appointment for IV infusion tomorrow.  [name]

## 2019-04-06 ENCOUNTER — Other Ambulatory Visit: Payer: Self-pay

## 2019-04-06 ENCOUNTER — Encounter (HOSPITAL_COMMUNITY)
Admission: RE | Admit: 2019-04-06 | Discharge: 2019-04-06 | Disposition: A | Discharge status: Home / Self Care | Payer: Self-pay | Source: Ambulatory Visit | Attending: Internal Medicine | Admitting: Internal Medicine

## 2019-04-06 MED ORDER — DEXTROSE 5 % IV SOLN
500.0000 mg | INTRAVENOUS | Status: DC
  Administered 2019-04-06: 10:00:00 500 mg via INTRAVENOUS
  Filled 2019-04-06: qty 25

## 2019-04-10 ENCOUNTER — Inpatient Hospital Stay: Payer: Self-pay | Admitting: Internal Medicine

## 2019-04-13 ENCOUNTER — Other Ambulatory Visit: Payer: Self-pay

## 2019-04-13 ENCOUNTER — Ambulatory Visit (HOSPITAL_COMMUNITY)
Admission: RE | Admit: 2019-04-13 | Discharge: 2019-04-13 | Disposition: A | Discharge status: Home / Self Care | Payer: Self-pay | Source: Ambulatory Visit | Attending: Internal Medicine | Admitting: Internal Medicine

## 2019-04-13 DIAGNOSIS — M659 Synovitis and tenosynovitis, unspecified: Secondary | ICD-10-CM | POA: Insufficient documentation

## 2019-04-13 MED ORDER — DEXTROSE 5 % IV SOLN
500.0000 mg | INTRAVENOUS | Status: DC
  Administered 2019-04-13: 500 mg via INTRAVENOUS
  Filled 2019-04-13: qty 25

## 2019-04-20 ENCOUNTER — Inpatient Hospital Stay (HOSPITAL_COMMUNITY): Admission: RE | Admit: 2019-04-20 | Payer: Self-pay | Source: Ambulatory Visit

## 2019-04-26 ENCOUNTER — Encounter (HOSPITAL_COMMUNITY): Payer: Self-pay

## 2019-04-27 ENCOUNTER — Encounter (HOSPITAL_COMMUNITY): Payer: Self-pay

## 2019-07-07 ENCOUNTER — Telehealth: Payer: Self-pay | Admitting: Dietician

## 2019-07-07 ENCOUNTER — Encounter: Payer: Self-pay | Admitting: Dietician

## 2019-07-07 NOTE — Telephone Encounter (Signed)
Called patient to follow up and schedule an appointment. Voicemail box is full and could not leave a message. Called patient at 9:40am. Sent letter in the mail to reach patient.  Addison Naegeli Dietetic Intern  03.19.2021

## 2019-08-14 ENCOUNTER — Encounter: Payer: Self-pay | Admitting: *Deleted

## 2020-02-19 IMAGING — CR DG HAND COMPLETE 3+V*L*
3 series · 3 of 3 positions shown · non-contrast
Comparison: None.

CLINICAL DATA: Pain and swelling

EXAM:
LEFT HAND - COMPLETE 3+ VIEW

[hand pa]
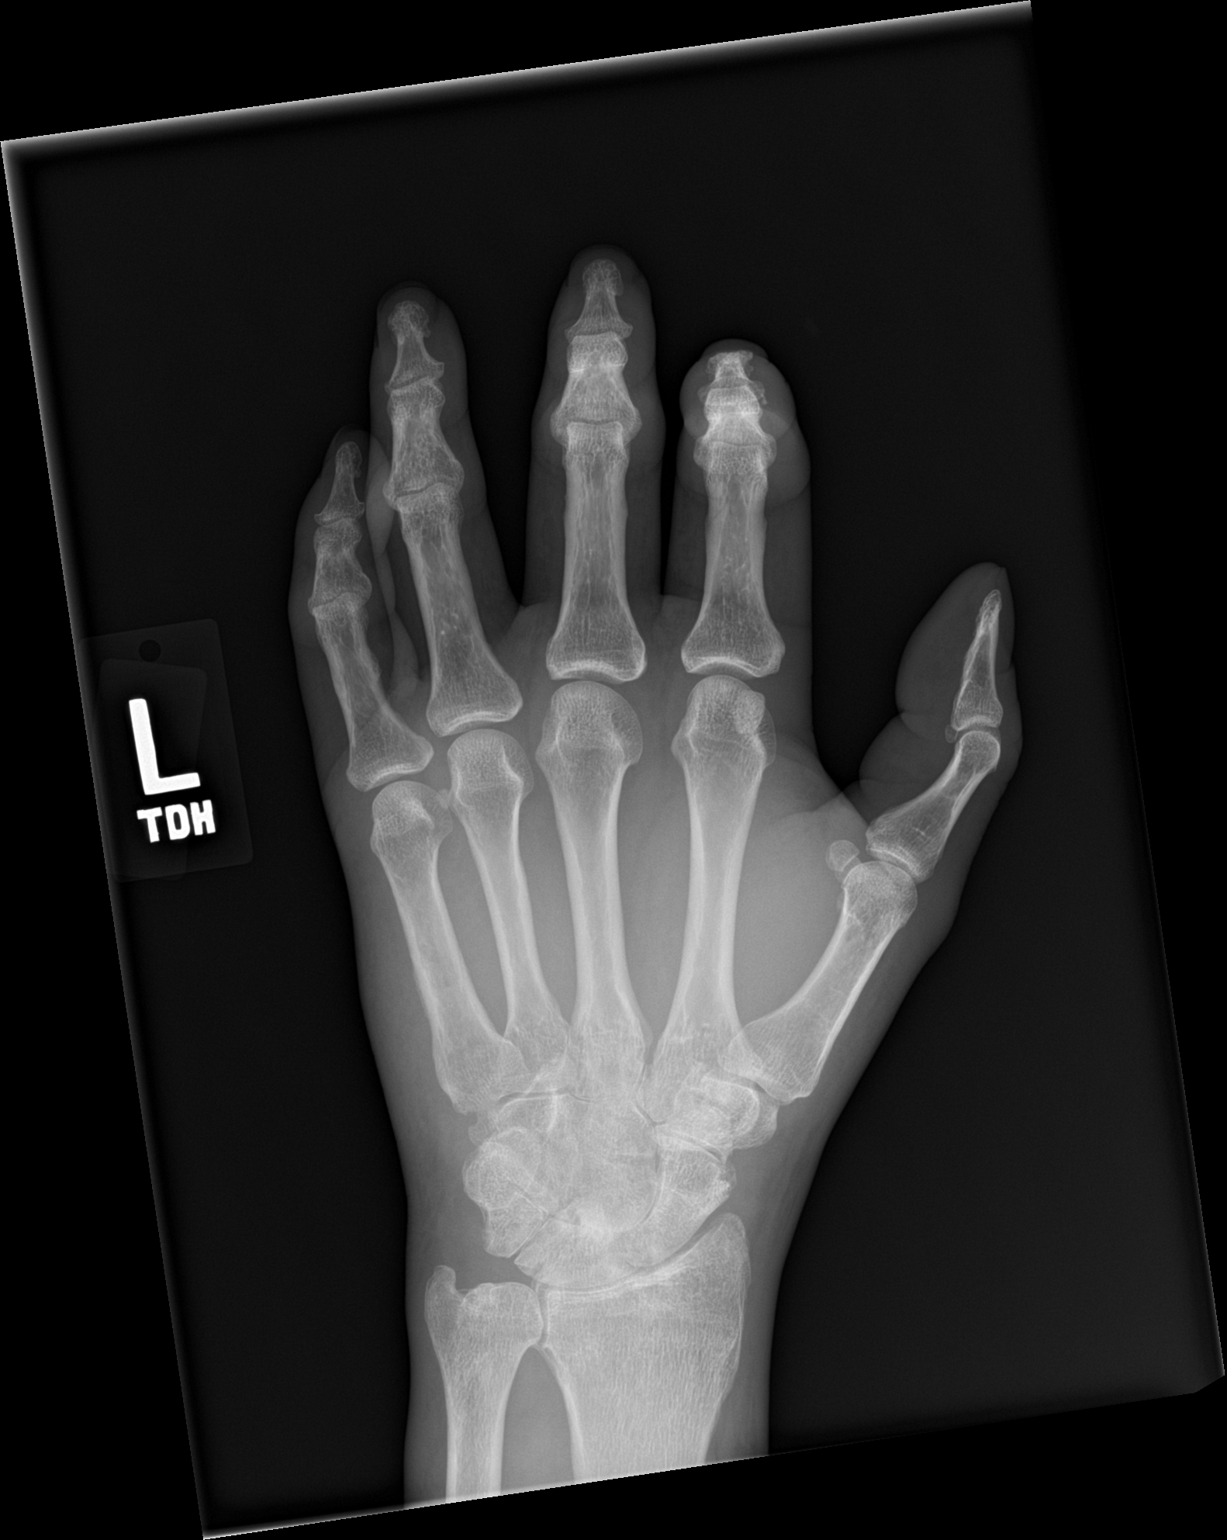

[hand obl]
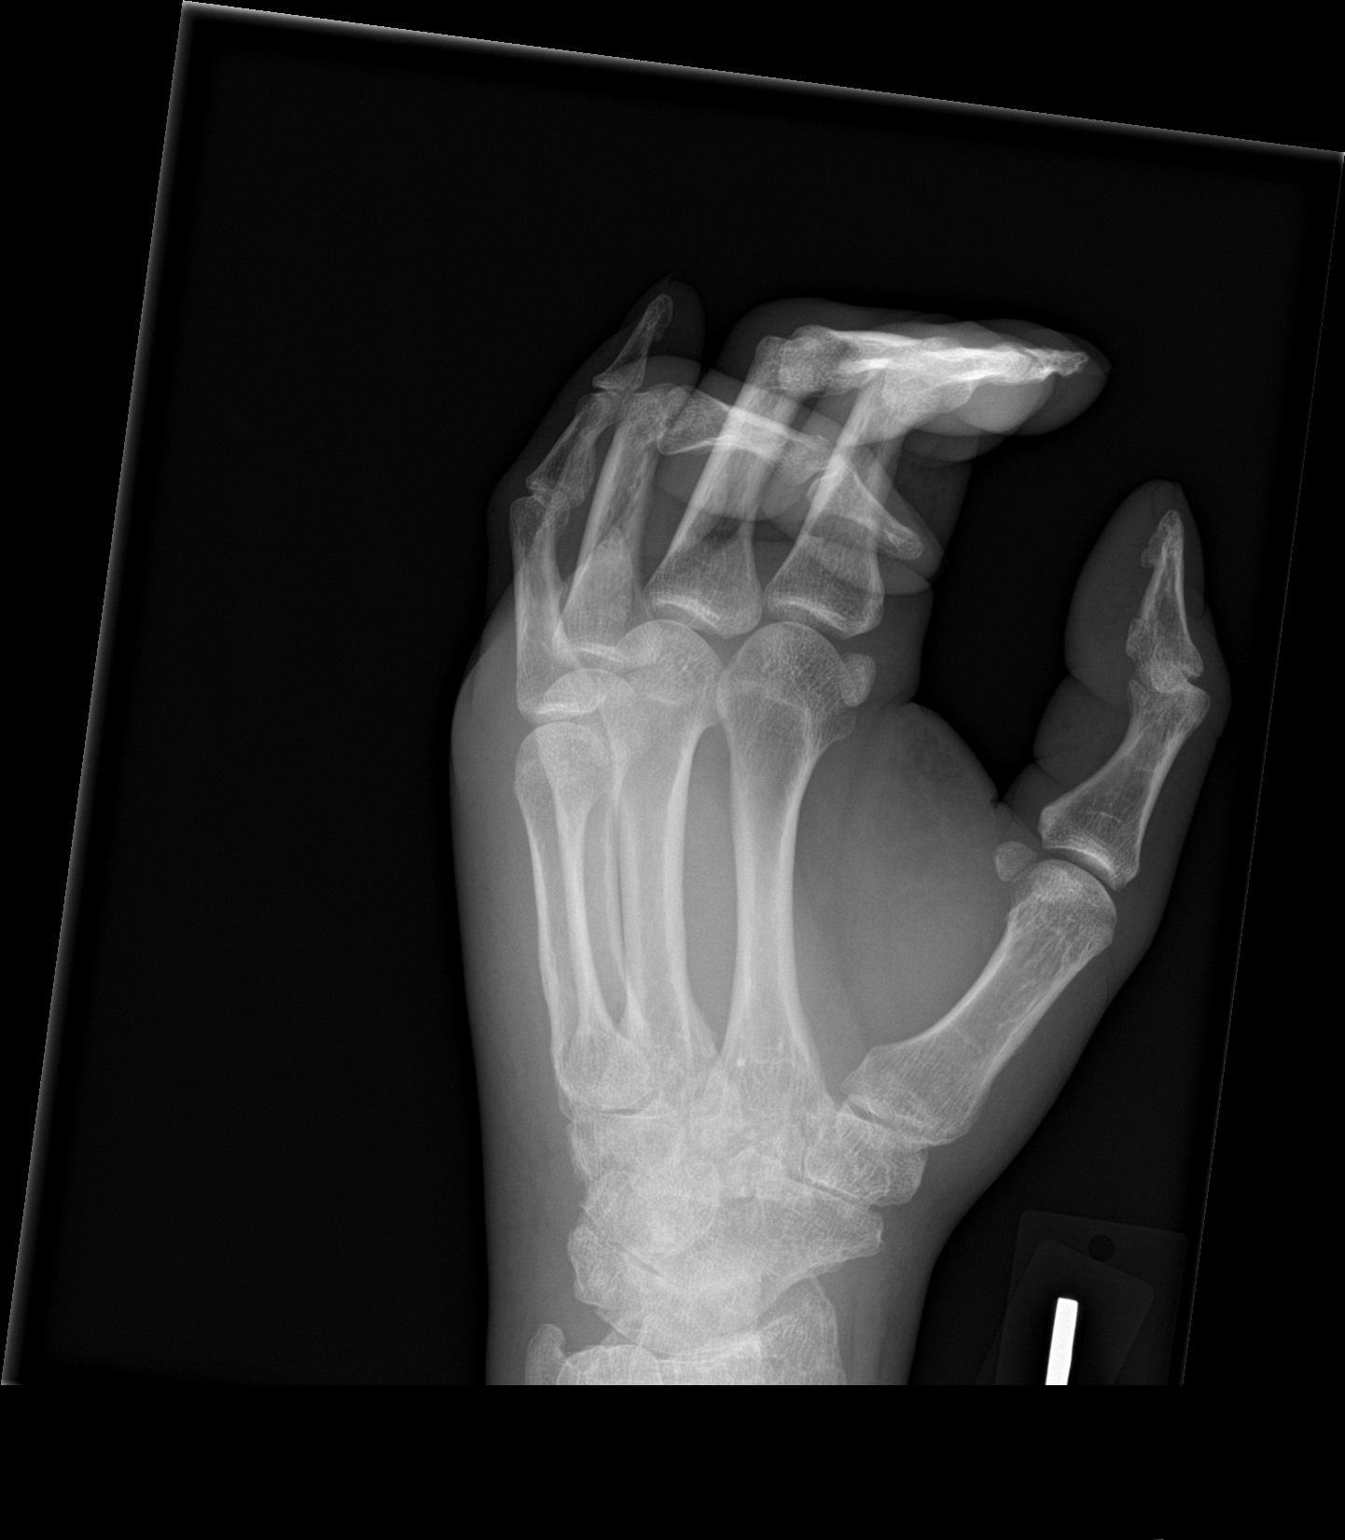

[hand lat]
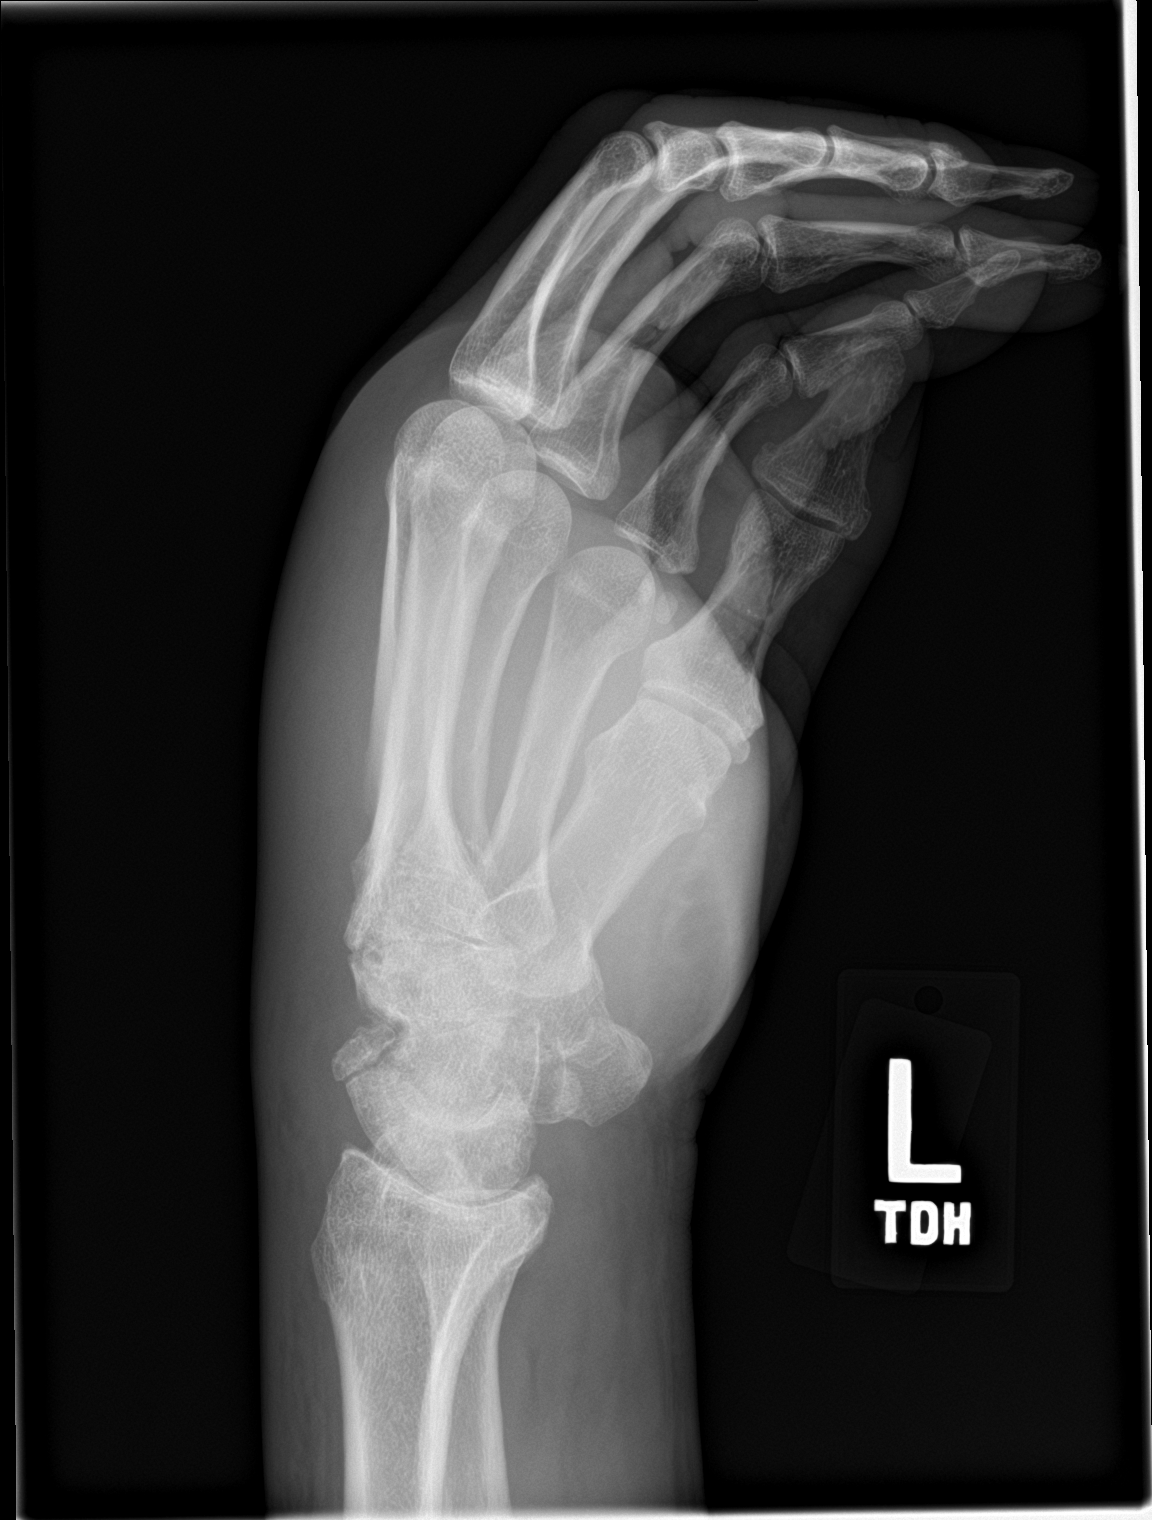

[3 of 3 positions shown; findings below may reference images not displayed]

FINDINGS: Frontal, oblique, and lateral views were obtained. There is
generalized soft tissue swelling, particularly more medially. No
fracture or dislocation evident. There is persistent flexion of
multiple PIP and DIP joints. There is osteoarthritic change
throughout the proximal and mid carpal row regions. No appreciable
joint space narrowing more distally. No erosive changes.
IMPRESSION: Generalized soft tissue swelling, particularly medially. Persistent
flexion of multiple distal joints. Fairly extensive osteoarthritic
change in the proximal and mid carpal row regions of the wrist. No
appreciable joint space narrowing more distally. No evidence of
erosion.

## 2020-02-19 IMAGING — CT CT HAND*L* W/CM
3 series · 11 of 34 positions shown, 13 images · non-contrast
Comparison: Radiograph same day

CLINICAL DATA: Hand swelling, question of infection

EXAM:
CT OF THE LEFT HAND WITHOUT CONTRAST
TECHNIQUE: Multidetector CT imaging of the left hand was performed according to
the standard protocol. Multiplanar CT image reconstructions were
also generated.

[Series 4: upper ext 1.5 st · axial · 0.36mm/px · z∈[-144,-18]mm · 3 of 137 slices shown, 4 images]
[im 32/137  soft-tissue]
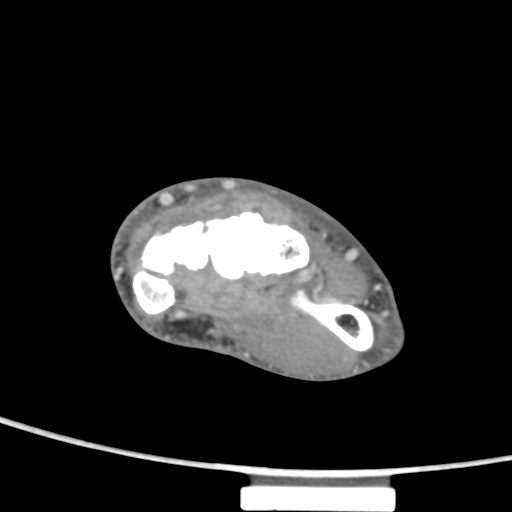
[im 32/137  bone]
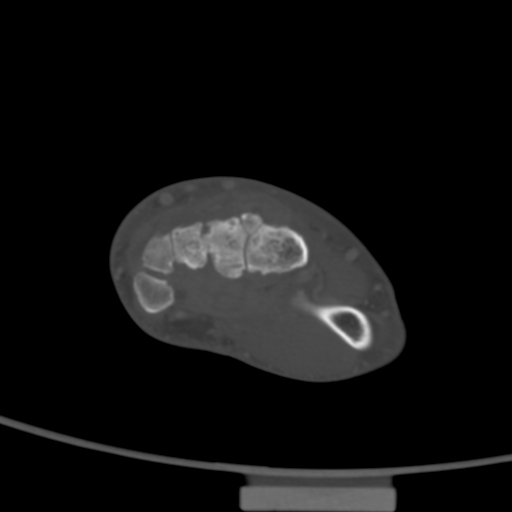
[im 74/137  bone]
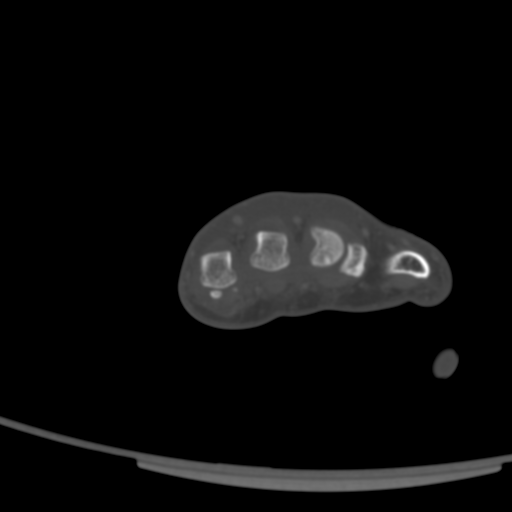
[im 116/137  bone]
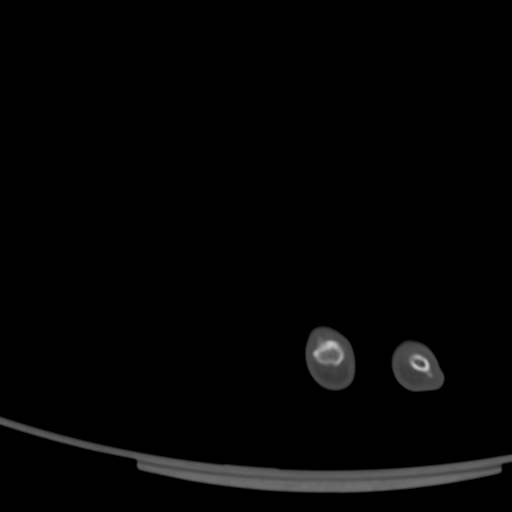

[Series 9: upper ext cor st · coronal · 0.33mm/px · 3 of 62 slices shown]
[im 13/62  bone]
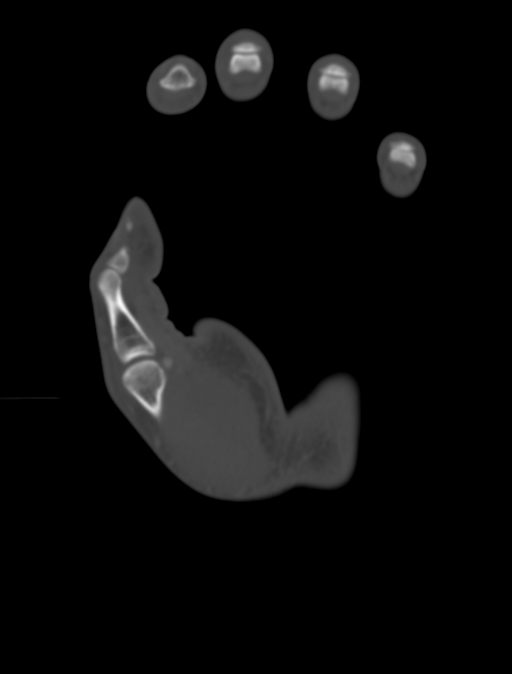
[im 25/62  bone]
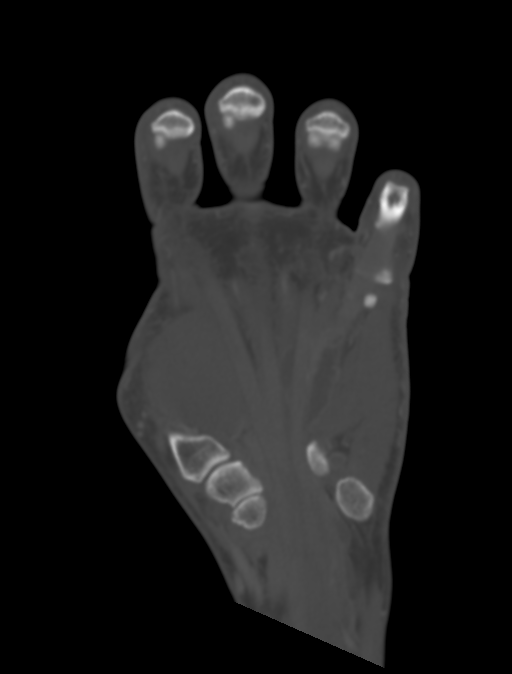
[im 37/62  bone]
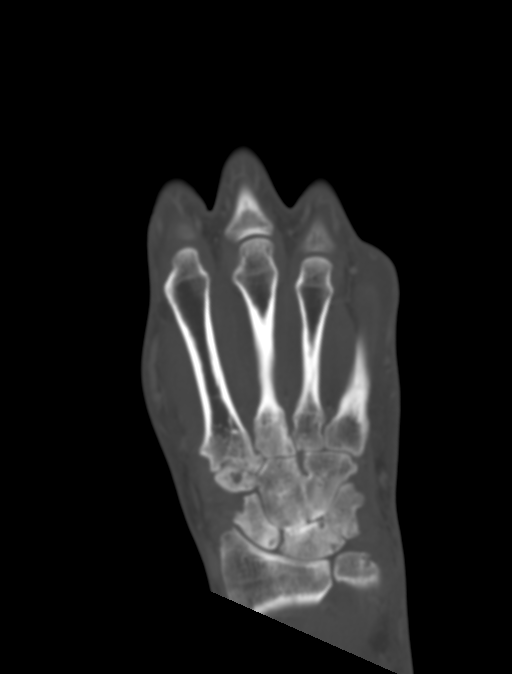

[Series 10: upper ext sag st · sagittal · 0.22mm/px · 5 of 99 slices shown, 6 images]
[im 35/99  bone]
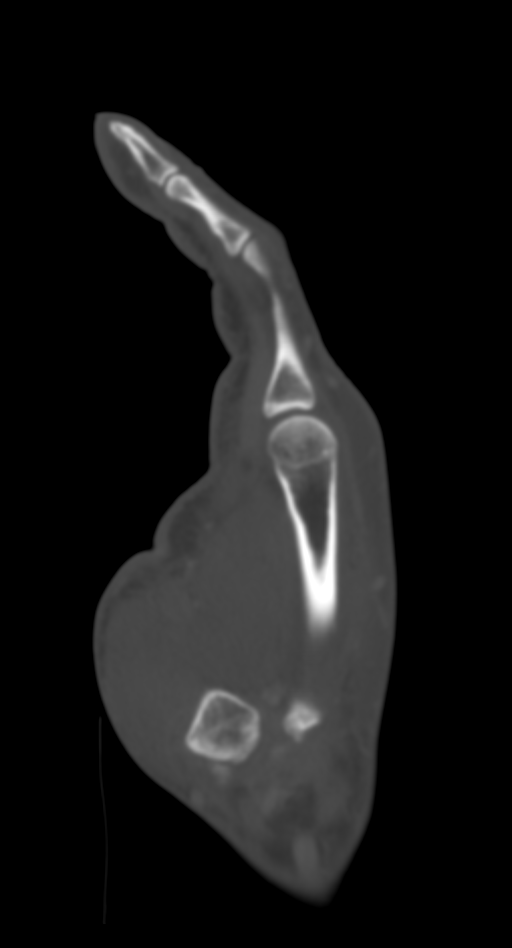
[im 42/99  bone]
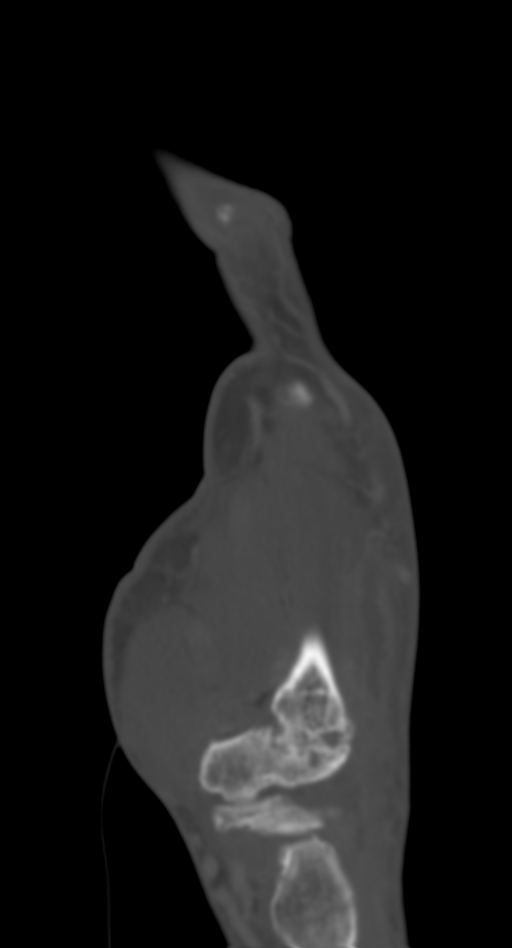
[im 50/99  soft-tissue]
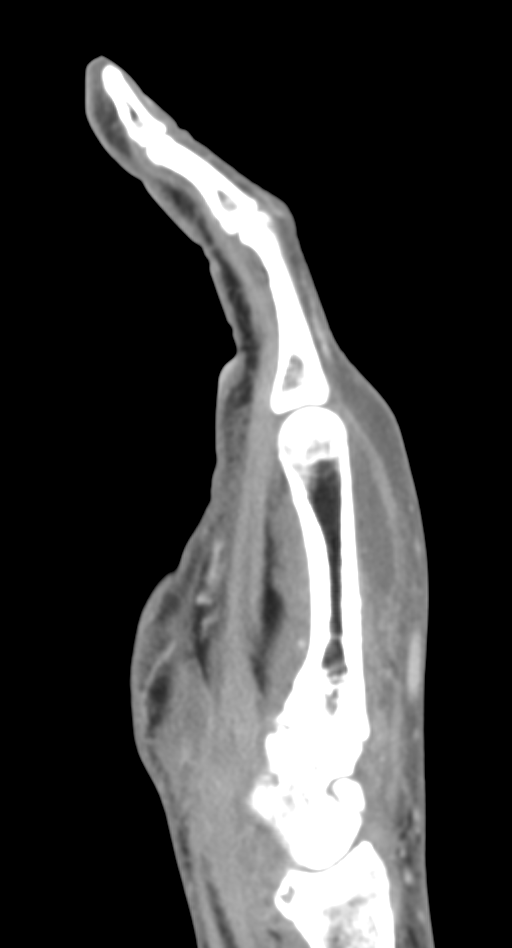
[im 50/99  bone]
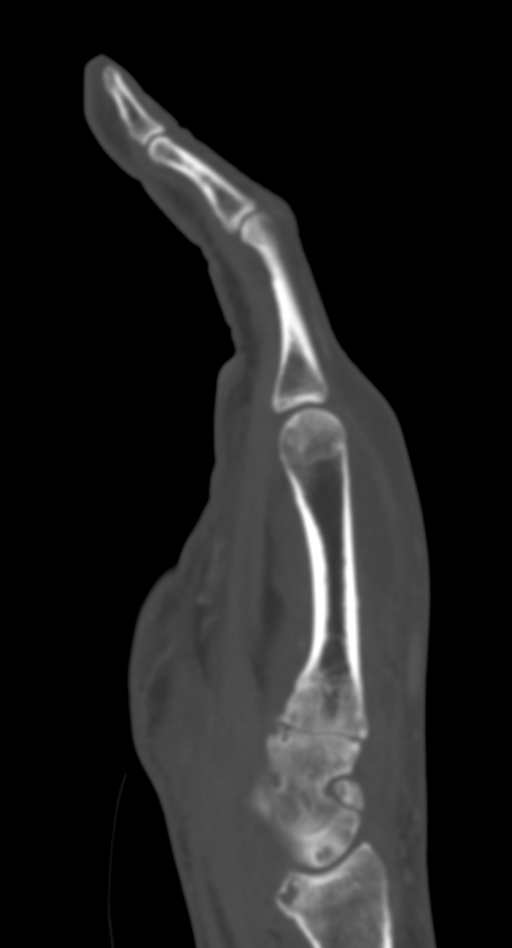
[im 57/99  bone]
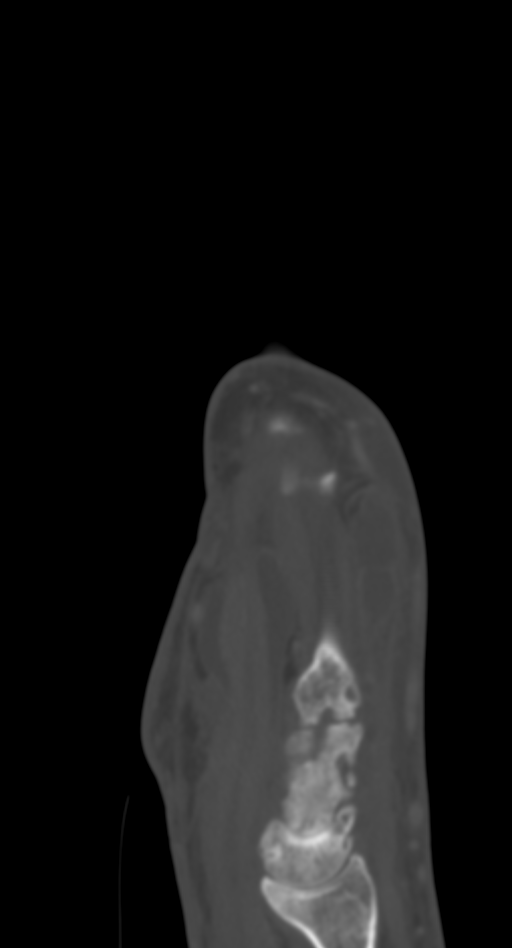
[im 64/99  bone]
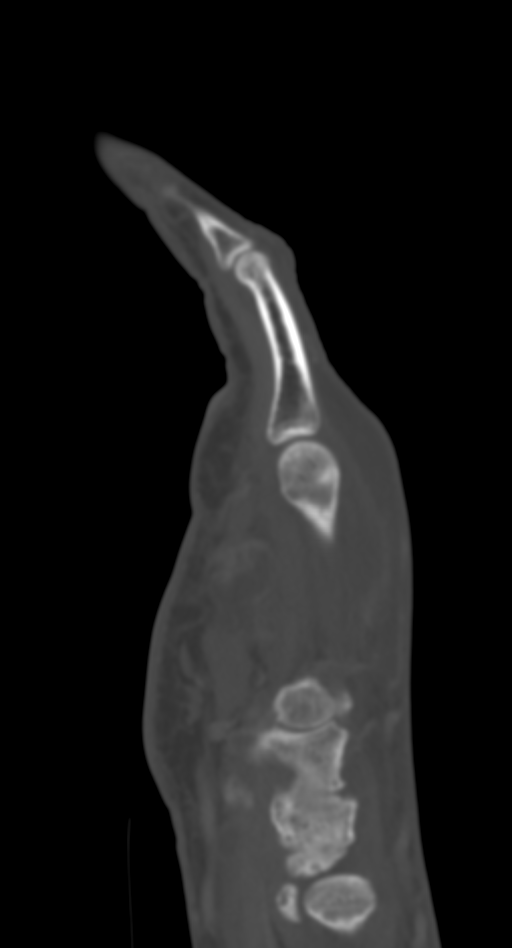

[11 of 34 positions shown; findings below may reference images not displayed]

FINDINGS: Bones/Joint/Cartilage

There is carpal coalition is noted at the capitate and hamate. There
is advanced degenerative changes seen through the carpal/metacarpal
joints and the radiocarpal joints with cystic/erosive changes and
diffuse sclerosis.

No osseous fracture is noted.

Ligaments

Suboptimally assessed by CT.

Muscles and Tendons

The peripherally enhancing fluid is seen surrounding the flexor
digitorum tendons at the level of the metacarpal bases and extending
predominantly around the third and fourth flexor tendons to the
level of the metacarpal heads. There is also increased
intrasubstance signal with a small amount of fluid seen surrounding
the flexor carpi ulnaris tendon. There is also peripherally
enhancing fluid seen extending around the extensor digitorum tendons
from the metacarpal bases extending distally to the MCP joints,
predominantly around the third and fourth digits. The muscles
surrounding the hand appear to be intact without evidence of atrophy
or focal tear.

Soft tissues

Diffuse dorsal subcutaneous edema and skin thickening is noted.
IMPRESSION: 1. Enhancing fluid around the flexor digitorum tendons as well as
the extensor digitorum tendons, predominantly the third and fourth
digits, as well as the flexor carpi ulnaris tendon. This could be
due to inflammatory or infectious tenosynovitis
2. Diffuse dorsal subcutaneous edema and skin thickening.
3. Findings concerning for inflammatory/erosive arthropathy
involving the carpals.
4. Carpal coalition of the hamate and capitate
5. These results were called by telephone at the time of
interpretation on 03/09/2019 at [DATE] to provider [place], [place], who
verbally acknowledged these results.

## 2022-07-26 ENCOUNTER — Emergency Department (HOSPITAL_COMMUNITY)
Admission: EM | Admit: 2022-07-26 | Discharge: 2022-07-26 | Disposition: A | Discharge status: Home / Self Care | Payer: Managed Care, Other (non HMO) | Attending: Emergency Medicine | Admitting: Emergency Medicine

## 2022-07-26 ENCOUNTER — Other Ambulatory Visit: Payer: Self-pay

## 2022-07-26 ENCOUNTER — Emergency Department (HOSPITAL_COMMUNITY): Payer: Managed Care, Other (non HMO)

## 2022-07-26 DIAGNOSIS — Z794 Long term (current) use of insulin: Secondary | ICD-10-CM | POA: Diagnosis not present

## 2022-07-26 DIAGNOSIS — Z79899 Other long term (current) drug therapy: Secondary | ICD-10-CM | POA: Insufficient documentation

## 2022-07-26 DIAGNOSIS — Z720 Tobacco use: Secondary | ICD-10-CM | POA: Diagnosis not present

## 2022-07-26 DIAGNOSIS — M25552 Pain in left hip: Secondary | ICD-10-CM | POA: Insufficient documentation

## 2022-07-26 DIAGNOSIS — Z7952 Long term (current) use of systemic steroids: Secondary | ICD-10-CM | POA: Diagnosis not present

## 2022-07-26 DIAGNOSIS — Z7984 Long term (current) use of oral hypoglycemic drugs: Secondary | ICD-10-CM | POA: Diagnosis not present

## 2022-07-26 DIAGNOSIS — G8929 Other chronic pain: Secondary | ICD-10-CM | POA: Diagnosis not present

## 2022-07-26 DIAGNOSIS — Z833 Family history of diabetes mellitus: Secondary | ICD-10-CM | POA: Diagnosis not present

## 2022-07-26 DIAGNOSIS — I10 Essential (primary) hypertension: Secondary | ICD-10-CM | POA: Diagnosis not present

## 2022-07-26 DIAGNOSIS — M1990 Unspecified osteoarthritis, unspecified site: Secondary | ICD-10-CM | POA: Diagnosis not present

## 2022-07-26 DIAGNOSIS — R202 Paresthesia of skin: Secondary | ICD-10-CM | POA: Insufficient documentation

## 2022-07-26 DIAGNOSIS — E119 Type 2 diabetes mellitus without complications: Secondary | ICD-10-CM | POA: Insufficient documentation

## 2022-07-26 DIAGNOSIS — J302 Other seasonal allergic rhinitis: Secondary | ICD-10-CM | POA: Diagnosis not present

## 2022-07-26 DIAGNOSIS — Z8249 Family history of ischemic heart disease and other diseases of the circulatory system: Secondary | ICD-10-CM | POA: Diagnosis not present

## 2022-07-26 LAB — CBG MONITORING, ED: Glucose-Capillary: 150 mg/dL — ABNORMAL HIGH (ref 70–99)

## 2022-07-26 LAB — BASIC METABOLIC PANEL
Anion gap: 13 (ref 5–15)
BUN: 10 mg/dL (ref 6–20)
CO2: 18 mmol/L — ABNORMAL LOW (ref 22–32)
Calcium: 7.5 mg/dL — ABNORMAL LOW (ref 8.9–10.3)
Chloride: 109 mmol/L (ref 98–111)
Creatinine, Ser: 0.66 mg/dL (ref 0.61–1.24)
GFR, Estimated: >60 mL/min (ref >60)
Glucose, Bld: 139 mg/dL — ABNORMAL HIGH (ref 70–99)
Potassium: 3.2 mmol/L — ABNORMAL LOW (ref 3.5–5.1)
Sodium: 140 mmol/L (ref 135–145)

## 2022-07-26 LAB — URINALYSIS, ROUTINE W REFLEX MICROSCOPIC
Bilirubin Urine: NEGATIVE
Glucose, UA: 50 mg/dL — AB
Hgb urine dipstick: NEGATIVE
Ketones, ur: NEGATIVE mg/dL
Leukocytes,Ua: NEGATIVE
Nitrite: NEGATIVE
Protein, ur: NEGATIVE mg/dL
Specific Gravity, Urine: 1.015 (ref 1.005–1.030)
pH: 5 (ref 5.0–8.0)

## 2022-07-26 LAB — RAPID URINE DRUG SCREEN, HOSP PERFORMED
Amphetamines: NOT DETECTED
Barbiturates: NOT DETECTED
Benzodiazepines: NOT DETECTED
Cocaine: POSITIVE — AB
Opiates: NOT DETECTED
Tetrahydrocannabinol: NOT DETECTED

## 2022-07-26 LAB — CBC
HCT: 33.6 % — ABNORMAL LOW (ref 39.0–52.0)
Hemoglobin: 11.3 g/dL — ABNORMAL LOW (ref 13.0–17.0)
MCH: 32.3 pg (ref 26.0–34.0)
MCHC: 33.6 g/dL (ref 30.0–36.0)
MCV: 96 fL (ref 80.0–100.0)
Platelets: 196 10*3/uL (ref 150–400)
RBC: 3.5 MIL/uL — ABNORMAL LOW (ref 4.22–5.81)
RDW: 12.8 % (ref 11.5–15.5)
WBC: 5.6 10*3/uL (ref 4.0–10.5)
nRBC: 0 % (ref 0.0–0.2)

## 2022-07-26 LAB — TROPONIN I (HIGH SENSITIVITY): Troponin I (High Sensitivity): 10 ng/L (ref <18)

## 2022-07-26 MED ORDER — SODIUM CHLORIDE 0.9 % IV BOLUS: 1000.0000 mL | Freq: Once | INTRAVENOUS | Status: DC

## 2022-07-26 MED ORDER — LISINOPRIL 10 MG PO TABS
10.0000 mg | ORAL_TABLET | Freq: Once | ORAL | Status: AC
  Administered 2022-07-26: 10 mg via ORAL
  Filled 2022-07-26: qty 1

## 2022-07-26 MED ORDER — POTASSIUM CHLORIDE CRYS ER 20 MEQ PO TBCR
20.0000 meq | EXTENDED_RELEASE_TABLET | Freq: Once | ORAL | Status: AC
  Administered 2022-07-26: 20 meq via ORAL
  Filled 2022-07-26: qty 1

## 2022-07-26 MED ORDER — LISINOPRIL 10 MG PO TABS: 10.0000 mg | ORAL_TABLET | Freq: Every day | ORAL | 3 refills | Status: DC

## 2022-07-26 NOTE — ED Provider Notes (Signed)
[place] EMERGENCY DEPARTMENT AT [facility] Provider Note   CSN: [id number] Arrival date & time: 07/26/22  1550     History {Add pertinent medical, surgical, social history, OB history to HPI:1} Chief Complaint  Patient presents with   Hypertension   Chest Pain    Carlos Charles is a 62 y.o. male.  He is presenting with multiple complaints.  He said he has a history of diabetes and hypertension but has not taken medicine for months after he ran out.  He does not usually go see a doctor.  He has had over a month of some intermittent numbness in both of his arms and chest down to his pelvis.  He has more longstanding pain in his left hip that radiates to his knee.  He had a visiting nurse come to the house today who found his blood pressure to be quite elevated and she recommended he come here for evaluation.  He denies any headache blurry vision pain in his chest shortness of breath.  He does smoke cigarettes and drinks alcohol although not every day.  He denies any drugs.  He does have health insurance.  He said he used to be on insulin and metformin but he did not like how it made him feel so he stopped it.  The history is provided by the patient.  Hypertension This is a chronic problem. Pertinent negatives include no chest pain, no abdominal pain and no shortness of breath. Nothing aggravates the symptoms. Nothing relieves the symptoms. He has tried nothing for the symptoms. The treatment provided no relief.       Home Medications Prior to Admission medications   Medication Sig Start Date End Date Taking? Authorizing Provider  acetaminophen (TYLENOL) 500 MG tablet Take 2 tablets (1,000 mg total) by mouth every 6 (six) hours. 03/13/19   Gardenia Phlegm, MD  blood glucose meter kit and supplies KIT Dispense based on patient and insurance preference. Use up to four times daily as directed. (FOR ICD-9 250.00, 250.01). 03/13/19   Gardenia Phlegm, MD  ibuprofen (ADVIL) 400 MG  tablet Take 1 tablet (400 mg total) by mouth every 6 (six) hours. 03/13/19   Gardenia Phlegm, MD  insulin glargine (LANTUS) 100 UNIT/ML injection Inject 0.12 mLs (12 Units total) into the skin daily. 03/14/19 06/12/19  Gardenia Phlegm, MD  lisinopril (ZESTRIL) 10 MG tablet Take 10 mg by mouth daily.    [provider]  metFORMIN (GLUCOPHAGE) 1000 MG tablet Take 1 tablet (1,000 mg total) by mouth 2 (two) times daily with a meal. 03/20/19   Burna Cash, MD      Allergies    Patient has no known allergies.    Review of Systems   Review of Systems  Constitutional:  Negative for fever.  HENT:  Negative for sore throat.   Eyes:  Negative for visual disturbance.  Respiratory:  Negative for shortness of breath.   Cardiovascular:  Negative for chest pain.  Gastrointestinal:  Negative for abdominal pain.  Genitourinary:  Negative for dysuria.  Musculoskeletal:  Positive for gait problem.  Skin:  Negative for rash.  Neurological:  Positive for numbness. Negative for speech difficulty and weakness.    Physical Exam Updated Vital Signs BP (!) 186/95 (BP Location: Right Arm)   Pulse 83   Temp 98.4 F (36.9 C) (Oral)   Resp 18   SpO2 96%  Physical Exam Vitals and nursing note reviewed.  Constitutional:  General: He is not in acute distress.    Appearance: Normal appearance. He is well-developed.  HENT:     Head: Normocephalic and atraumatic.  Eyes:     Conjunctiva/sclera: Conjunctivae normal.  Cardiovascular:     Rate and Rhythm: Normal rate and regular rhythm.     Heart sounds: Normal heart sounds. No murmur heard. Pulmonary:     Effort: Pulmonary effort is normal. No respiratory distress.     Breath sounds: Normal breath sounds.  Abdominal:     Palpations: Abdomen is soft.     Tenderness: There is no abdominal tenderness.  Musculoskeletal:        General: No swelling. Normal range of motion.     Cervical back: Neck supple.     Right lower leg: No  tenderness. No edema.     Left lower leg: No tenderness. No edema.  Skin:    General: Skin is warm and dry.     Capillary Refill: Capillary refill takes less than 2 seconds.  Neurological:     Mental Status: He is alert.     Cranial Nerves: No cranial nerve deficit.     Sensory: Sensory deficit present.     Motor: No weakness.     ED Results / Procedures / Treatments   Labs (all labs ordered are listed, but only abnormal results are displayed) Labs Reviewed  BASIC METABOLIC PANEL  CBC  CBG MONITORING, ED  TROPONIN I (HIGH SENSITIVITY)    EKG None  Radiology No results found.  Procedures Procedures  {Document cardiac monitor, telemetry assessment procedure when appropriate:1}  Medications Ordered in ED Medications - No data to display  ED Course/ Medical Decision Making/ A&P   {   Click here for ABCD2, HEART and other calculatorsREFRESH Note before signing :1}                          Medical Decision Making Amount and/or Complexity of Data Reviewed Labs: ordered. Radiology: ordered.   This patient complains of ***; this involves an extensive number of treatment Options and is a complaint that carries with it a high risk of complications and morbidity. The differential includes ***  I ordered, reviewed and interpreted labs, which included *** I ordered medication *** and reviewed PMP when indicated. I ordered imaging studies which included *** and I independently    visualized and interpreted imaging which showed *** Additional history obtained from *** Previous records obtained and reviewed *** I consulted *** and discussed lab and imaging findings and discussed disposition.  Cardiac monitoring reviewed, *** Social determinants considered, *** Critical Interventions: ***  After the interventions stated above, I reevaluated the patient and found *** Admission and further testing considered, ***   {Document critical care time when  appropriate:1} {Document review of labs and clinical decision tools ie heart score, Chads2Vasc2 etc:1}  {Document your independent review of radiology images, and any outside records:1} {Document your discussion with family members, caretakers, and with consultants:1} {Document social determinants of health affecting pt's care:1} {Document your decision making why or why not admission, treatments were needed:1} Final Clinical Impression(s) / ED Diagnoses Final diagnoses:  None    Rx / DC Orders ED Discharge Orders     None

## 2022-07-26 NOTE — ED Triage Notes (Signed)
Pt with numbness to left arm and both sides of chest.  Pt states he had a visit from home health for his insurance and was told his BP is high  Feels shob.  No n/v/d. Feels "like when your hand goes to sleep" in his arms and chest.

## 2022-07-26 NOTE — Discharge Instructions (Signed)
You were seen in the emergency department for elevated blood pressure, numbness in your arms and chest, left hip pain.  Your hip x-ray showed significant arthritis and this will need follow-up with orthopedics.  Your blood pressure was elevated and you were given some medication and we have sent a prescription to your pharmacy.  CAT scan of your head did not show any signs of stroke.  Will be important that you establish care with a primary care doctor so they can help you manage your blood pressure and elevated blood sugar.  Return to the emergency department if any worsening or concerning symptoms

## 2022-07-26 NOTE — ED Notes (Signed)
Pt gone to CT 

## 2022-11-10 ENCOUNTER — Encounter (HOSPITAL_COMMUNITY): Payer: Self-pay

## 2022-11-10 ENCOUNTER — Ambulatory Visit (INDEPENDENT_AMBULATORY_CARE_PROVIDER_SITE_OTHER): Payer: 59

## 2022-11-10 ENCOUNTER — Ambulatory Visit (HOSPITAL_COMMUNITY)
Admission: EM | Admit: 2022-11-10 | Discharge: 2022-11-10 | Disposition: A | Discharge status: Home / Self Care | Payer: 59 | Attending: Family Medicine | Admitting: Family Medicine

## 2022-11-10 DIAGNOSIS — M25562 Pain in left knee: Secondary | ICD-10-CM

## 2022-11-10 DIAGNOSIS — M25552 Pain in left hip: Secondary | ICD-10-CM

## 2022-11-10 DIAGNOSIS — M87052 Idiopathic aseptic necrosis of left femur: Secondary | ICD-10-CM | POA: Diagnosis not present

## 2022-11-10 DIAGNOSIS — M1612 Unilateral primary osteoarthritis, left hip: Secondary | ICD-10-CM | POA: Diagnosis not present

## 2022-11-10 MED ORDER — MELOXICAM 15 MG PO TABS: 15.0000 mg | ORAL_TABLET | Freq: Every day | ORAL | 0 refills | Status: DC

## 2022-11-10 NOTE — ED Triage Notes (Signed)
Patient c/o left hip and left knee pain x 2 months. Patient denies swelling or injury. Patient reports difficulty walking.  Patient states he has been taking Tylenol with no relief and the last dose was 2 days ago.

## 2022-11-10 NOTE — ED Provider Notes (Signed)
[facility]    CSN: [id number] Arrival date & time: 11/10/22  6962      History   Chief Complaint Chief Complaint  Patient presents with   Hip Pain   Knee Pain    HPI Carlos Charles is a 62 y.o. male.    Hip Pain  Knee Pain Here for left hip and knee pain.  His left hip has been bothering him a long time but has been worse in the last 2 months.  I can see where he saw [facility] in care everywhere in 2019 and there is a diagnosis of osteonecrosis of the hip  .  His left knee has been bothering him for about 2 weeks.  No edema.  No trauma or fall.  He does have more pain when he is up on it.  Past medical history difficult for diabetes.  He states he is taking medicines for it, but does not have primary care  Past Medical History:  Diagnosis Date   Diabetes mellitus without complication (HCC)    Essential hypertension    Hyperlipidemia    Osteoarthritis of L hip     Patient Active Problem List   Diagnosis Date Noted   Type 2 diabetes mellitus (HCC) 03/09/2019   Hypertension 03/09/2019   Hand swelling, left 03/09/2019   Tenosynovitis of left hand 03/09/2019    Past Surgical History:  Procedure Laterality Date   I & D EXTREMITY Left 03/09/2019   Procedure: IRRIGATION AND DEBRIDEMENT Left Hand;  Surgeon: Ernest Mallick, MD;  Location: [facility];  Service: Orthopedics;  Laterality: Left;       Home Medications    Prior to Admission medications   Medication Sig Start Date End Date Taking? Authorizing Provider  meloxicam (MOBIC) 15 MG tablet Take 1 tablet (15 mg total) by mouth daily. 11/10/22  Yes Zenia Resides, MD  acetaminophen (TYLENOL) 500 MG tablet Take 2 tablets (1,000 mg total) by mouth every 6 (six) hours. 03/13/19   Gardenia Phlegm, MD  blood glucose meter kit and supplies KIT Dispense based on patient and insurance preference. Use up to four times daily as directed. (FOR ICD-9 250.00, 250.01). 03/13/19   Gardenia Phlegm, MD   insulin glargine (LANTUS) 100 UNIT/ML injection Inject 0.12 mLs (12 Units total) into the skin daily. 03/14/19 06/12/19  Gardenia Phlegm, MD  lisinopril (ZESTRIL) 10 MG tablet Take 1 tablet (10 mg total) by mouth daily. 07/26/22   Terrilee Files, MD  metFORMIN (GLUCOPHAGE) 1000 MG tablet Take 1 tablet (1,000 mg total) by mouth 2 (two) times daily with a meal. 03/20/19   Burna Cash, MD    Family History Family History  Problem Relation Age of Onset   Diabetes Mother    Healthy Father    Diabetes Sister     Social History Social History   Tobacco Use   Smoking status: Every Day    Current packs/day: 1.00    Types: Cigarettes   Smokeless tobacco: Never   Tobacco comments:    1/3 pack per day   Vaping Use   Vaping status: Some Days   Substances: Nicotine, Flavoring  Substance Use Topics   Alcohol use: Yes   Drug use: Yes    Types: Marijuana     Allergies   Patient has no known allergies.   Review of Systems Review of Systems   Physical Exam Triage Vital Signs ED Triage Vitals  Encounter Vitals Group  BP 11/10/22 0926 (!) 180/97     Systolic BP Percentile --      Diastolic BP Percentile --      Pulse Rate 11/10/22 0926 80     Resp 11/10/22 0926 16     Temp 11/10/22 0926 98 F (36.7 C)     Temp Source 11/10/22 0926 Oral     SpO2 11/10/22 0926 98 %     Weight --      Height --      Head Circumference --      Peak Flow --      Pain Score 11/10/22 0928 10     Pain Loc --      Pain Education --      Exclude from Growth Chart --    No data found.  Updated Vital Signs BP (!) 180/97 (BP Location: Right Arm)   Pulse 80   Temp 98 F (36.7 C) (Oral)   Resp 16   SpO2 98%   Visual Acuity Right Eye Distance:   Left Eye Distance:   Bilateral Distance:    Right Eye Near:   Left Eye Near:    Bilateral Near:     Physical Exam Vitals reviewed.  Constitutional:      General: He is not in acute distress.    Appearance: He is not  ill-appearing, toxic-appearing or diaphoretic.  HENT:     Mouth/Throat:     Mouth: Mucous membranes are moist.  Cardiovascular:     Rate and Rhythm: Normal rate and regular rhythm.     Heart sounds: No murmur heard. Pulmonary:     Effort: Pulmonary effort is normal.     Breath sounds: Normal breath sounds.  Musculoskeletal:     Right lower leg: No edema.     Left lower leg: No edema.     Comments: There is tenderness over the lateral left hip at the iliac crest.  There is no edema or effusion noted of the knee.  The joint line is tender on the medial and lateral side.  No rash and no laxity.  Skin:    Coloration: Skin is not jaundiced or pale.  Neurological:     General: No focal deficit present.     Mental Status: He is oriented to person, place, and time.  Psychiatric:        Behavior: Behavior normal.      UC Treatments / Results  Labs (all labs ordered are listed, but only abnormal results are displayed) Labs Reviewed - No data to display  EKG   Radiology No results found.  Procedures Procedures (including critical care time)  Medications Ordered in UC Medications - No data to display  Initial Impression / Assessment and Plan / UC Course  I have reviewed the triage vital signs and the nursing notes.  Pertinent labs & imaging results that were available during my care of the patient were reviewed by me and considered in my medical decision making (see chart for details).        Hip x-ray is unchanged from previous and shows extensive arthritic changes in probable osteonecrosis.  His knee x-ray also shows some sclerotic changes that are present on prior knee x-rays.  There are some degenerative changes there also.  Meloxicam is sent in to take daily for pain and arthritis.  He is given contact information for orthopedics, and staff will help him set up primary care appointment  Last EGFR was greater than 60  Final  Clinical Impressions(s) / UC Diagnoses    Final diagnoses:  Left hip pain  Acute pain of left knee     Discharge Instructions      Your x-rays show some extensive arthritis of your left hip, though the appearance of the x-ray is stable from when you last had it done in April. There are also some signs of arthritis in your left knee.  The radiologist will read your x-rays and symptoms report, and we will advise you if there is anything significantly different from my review.  Meloxicam 15 mg--1 daily for pain and arthritis.  Please call orthopedics for evaluation  You can use the QR code/website at the back of the summary paperwork to schedule yourself a new patient appointment with primary care      ED Prescriptions     Medication Sig Dispense Auth. Provider   meloxicam (MOBIC) 15 MG tablet Take 1 tablet (15 mg total) by mouth daily. 30 tablet Ajanee Buren, Janace Aris, MD      I have reviewed the PDMP during this encounter.   Zenia Resides, MD 11/10/22 1045

## 2022-11-10 NOTE — Discharge Instructions (Signed)
Your x-rays show some extensive arthritis of your left hip, though the appearance of the x-ray is stable from when you last had it done in April. There are also some signs of arthritis in your left knee.  The radiologist will read your x-rays and symptoms report, and we will advise you if there is anything significantly different from my review.  Meloxicam 15 mg--1 daily for pain and arthritis.  Please call orthopedics for evaluation  You can use the QR code/website at the back of the summary paperwork to schedule yourself a new patient appointment with primary care

## 2022-11-12 ENCOUNTER — Telehealth: Payer: Self-pay | Admitting: General Practice

## 2022-11-12 NOTE — Telephone Encounter (Signed)
Pt sent an appt request via MyChart for "left hip and left knee" concerns; called pt and replied back on MyChart to ask pt if he wanted to reschedule his New Patient appt scheduled for 10/07 and that we have a new patient available next Wednesday and to call office if he wanted to schedule.

## 2022-12-24 ENCOUNTER — Encounter (HOSPITAL_COMMUNITY): Payer: Self-pay

## 2022-12-24 ENCOUNTER — Ambulatory Visit (HOSPITAL_COMMUNITY)
Admission: EM | Admit: 2022-12-24 | Discharge: 2022-12-24 | Disposition: A | Discharge status: Home / Self Care | Payer: 59 | Attending: Internal Medicine | Admitting: Internal Medicine

## 2022-12-24 DIAGNOSIS — I10 Essential (primary) hypertension: Secondary | ICD-10-CM | POA: Diagnosis not present

## 2022-12-24 MED ORDER — IBUPROFEN 600 MG PO TABS: 600.0000 mg | ORAL_TABLET | Freq: Four times a day (QID) | ORAL | 0 refills | Status: DC | PRN

## 2022-12-24 NOTE — Discharge Instructions (Addendum)
Please take your medications for blood pressure as prescribed Please decrease salt intake to less than 3 g in 24 hours Please take ibuprofen as needed for pain If you have worsening headaches, confusion, persistent numbness and tingling or weakness in the extremities please go to the emergency room to be evaluated.

## 2022-12-24 NOTE — ED Provider Notes (Addendum)
[facility]    CSN: [id number] Arrival date & time: 12/24/22  1632      History   Chief Complaint Chief Complaint  Patient presents with   Headache    HPI Carlos Charles is a 62 y.o. male with a history of hypertension who recently ran out of his medications comes to urgent care with a 3-day history of global headache.  Headache is currently 8 out of 10.  He denies any sensitivity to light or noise.  No trauma to the head.  Patient previously had some blurry vision which is currently resolved.  His headache started when he ran out of his medications 3 days ago.  He has filled his medications and started taking it today.  No nausea or vomiting.  No chest pain or chest pressure.  No abdominal pain.   HPI  Past Medical History:  Diagnosis Date   Diabetes mellitus without complication (HCC)    Essential hypertension    Hyperlipidemia    Osteoarthritis of L hip     Patient Active Problem List   Diagnosis Date Noted   Type 2 diabetes mellitus (HCC) 03/09/2019   Hypertension 03/09/2019   Hand swelling, left 03/09/2019   Tenosynovitis of left hand 03/09/2019    Past Surgical History:  Procedure Laterality Date   I & D EXTREMITY Left 03/09/2019   Procedure: IRRIGATION AND DEBRIDEMENT Left Hand;  Surgeon: Ernest Mallick, MD;  Location: [facility];  Service: Orthopedics;  Laterality: Left;       Home Medications    Prior to Admission medications   Medication Sig Start Date End Date Taking? Authorizing Provider  ibuprofen (ADVIL) 600 MG tablet Take 1 tablet (600 mg total) by mouth every 6 (six) hours as needed. 12/24/22  Yes Kairee Isa, Britta Mccreedy, MD  lisinopril (ZESTRIL) 10 MG tablet Take 1 tablet (10 mg total) by mouth daily. 07/26/22  Yes Terrilee Files, MD  metFORMIN (GLUCOPHAGE) 1000 MG tablet Take 1 tablet (1,000 mg total) by mouth 2 (two) times daily with a meal. 03/20/19  Yes Santos-Sanchez, Chelsea Primus, MD  blood glucose meter kit and supplies KIT Dispense based on  patient and insurance preference. Use up to four times daily as directed. (FOR ICD-9 250.00, 250.01). 03/13/19   Gardenia Phlegm, MD    Family History Family History  Problem Relation Age of Onset   Diabetes Mother    Healthy Father    Diabetes Sister     Social History Social History   Tobacco Use   Smoking status: Every Day    Current packs/day: 1.00    Types: Cigarettes   Smokeless tobacco: Never   Tobacco comments:    1/3 pack per day   Vaping Use   Vaping status: Some Days   Substances: Nicotine, Flavoring  Substance Use Topics   Alcohol use: Yes   Drug use: Yes    Types: Marijuana     Allergies   Patient has no known allergies.   Review of Systems Review of Systems As per HPI  Physical Exam Triage Vital Signs ED Triage Vitals  Encounter Vitals Group     BP 12/24/22 1741 (!) 176/97     Systolic BP Percentile --      Diastolic BP Percentile --      Pulse Rate 12/24/22 1741 70     Resp 12/24/22 1741 18     Temp 12/24/22 1741 98.3 F (36.8 C)     Temp Source 12/24/22 1741 Oral  SpO2 12/24/22 1741 97 %     Weight 12/24/22 1740 180 lb (81.6 kg)     Height 12/24/22 1740 5\' 11"  (1.803 m)     Head Circumference --      Peak Flow --      Pain Score 12/24/22 1739 8     Pain Loc --      Pain Education --      Exclude from Growth Chart --    No data found.  Updated Vital Signs BP (!) 176/97 (BP Location: Left Arm)   Pulse 70   Temp 98.3 F (36.8 C) (Oral)   Resp 18   Ht 5\' 11"  (1.803 m)   Wt 81.6 kg   SpO2 97%   BMI 25.10 kg/m   Visual Acuity Right Eye Distance:   Left Eye Distance:   Bilateral Distance:    Right Eye Near:   Left Eye Near:    Bilateral Near:     Physical Exam Vitals and nursing note reviewed.  Constitutional:      General: He is not in acute distress.    Appearance: He is not ill-appearing.  Cardiovascular:     Rate and Rhythm: Normal rate and regular rhythm.     Heart sounds: Normal heart sounds.  Pulmonary:      Effort: Pulmonary effort is normal.     Breath sounds: Normal breath sounds.  Neurological:     Mental Status: He is alert.     GCS: GCS eye subscore is 4. GCS verbal subscore is 5. GCS motor subscore is 6.     Cranial Nerves: No cranial nerve deficit or dysarthria.     Sensory: No sensory deficit.     Motor: No weakness.     Deep Tendon Reflexes: Reflexes normal.      UC Treatments / Results  Labs (all labs ordered are listed, but only abnormal results are displayed) Labs Reviewed - No data to display  EKG   Radiology No results found.  Procedures Procedures (including critical care time)  Medications Ordered in UC Medications - No data to display  Initial Impression / Assessment and Plan / UC Course  I have reviewed the triage vital signs and the nursing notes.  Pertinent labs & imaging results that were available during my care of the patient were reviewed by me and considered in my medical decision making (see chart for details).     1.  Uncontrolled hypertension: Patient is encouraged to be compliant with his medications Continue lisinopril 10 mg orally daily. Patient is advised to decrease salt intake to less than 3 g a day Return to ED precautions given Ibuprofen as needed for pain and/or fever.  Final Clinical Impressions(s) / UC Diagnoses   Final diagnoses:  Uncontrolled hypertension     Discharge Instructions      Please take your medications for blood pressure as prescribed Please decrease salt intake to less than 3 g in 24 hours Please take ibuprofen as needed for pain If you have worsening headaches, confusion, persistent numbness and tingling or weakness in the extremities please go to the emergency room to be evaluated.    ED Prescriptions     Medication Sig Dispense Auth. Provider   ibuprofen (ADVIL) 600 MG tablet Take 1 tablet (600 mg total) by mouth every 6 (six) hours as needed. 30 tablet Shayn Madole, Britta Mccreedy, MD      PDMP not  reviewed this encounter.   Merrilee Jansky, MD 12/24/22 [phone]  Merrilee Jansky, MD 12/24/22 [phone]

## 2022-12-24 NOTE — ED Triage Notes (Signed)
Patient being seen for headaches onset 3 days ago. No history of migraines. Patient has run out of his lisinopril for 3 days and just got it back yesterday.   Patient had blurred vision with the headache. Took his lisinopril an hour ago.

## 2023-01-11 ENCOUNTER — Encounter: Payer: Self-pay | Admitting: Family Medicine

## 2023-01-25 ENCOUNTER — Ambulatory Visit: Payer: 59 | Admitting: Family Medicine

## 2023-02-12 ENCOUNTER — Ambulatory Visit (HOSPITAL_COMMUNITY)
Admission: RE | Admit: 2023-02-12 | Discharge: 2023-02-12 | Disposition: A | Discharge status: Home / Self Care | Payer: 59 | Source: Ambulatory Visit | Attending: Family Medicine | Admitting: Family Medicine

## 2023-02-12 ENCOUNTER — Encounter (HOSPITAL_COMMUNITY): Payer: Self-pay

## 2023-02-12 VITALS — BP 170/86 | HR 77 | Temp 97.6°F | Resp 18

## 2023-02-12 DIAGNOSIS — R1013 Epigastric pain: Secondary | ICD-10-CM

## 2023-02-12 MED ORDER — DICYCLOMINE HCL 20 MG PO TABS: 20.0000 mg | ORAL_TABLET | Freq: Four times a day (QID) | ORAL | 0 refills | Status: DC | PRN

## 2023-02-12 MED ORDER — FAMOTIDINE 20 MG PO TABS: 20.0000 mg | ORAL_TABLET | Freq: Two times a day (BID) | ORAL | 0 refills | Status: DC

## 2023-02-12 NOTE — ED Triage Notes (Signed)
Pt states he has abdominal pain x 2 days and some diarrhea. He states it might be something he ate. He hasn't taken any meds.

## 2023-02-12 NOTE — ED Provider Notes (Signed)
[facility]    CSN: [id number] Arrival date & time: 02/12/23  1202      History   Chief Complaint Chief Complaint  Patient presents with   Abdominal Pain   Diarrhea    HPI Carlos Charles is a 62 y.o. male.    Abdominal Pain Associated symptoms: diarrhea   Diarrhea Associated symptoms: abdominal pain   Here for some abdominal cramping and diarrhea.  The cramping began about 2 days ago, after he ate some smoked [place] next that he thinks might have been a little spoiled.  He has not had any fever or chills.  The loose stools have only happened a couple of times and his last bowel movement was last night.  He states the cramping is starting to improve now.  He has not had any nausea or vomiting.    Past Medical History:  Diagnosis Date   Diabetes mellitus without complication (HCC)    Essential hypertension    Hyperlipidemia    Osteoarthritis of L hip     Patient Active Problem List   Diagnosis Date Noted   Type 2 diabetes mellitus (HCC) 03/09/2019   Hypertension 03/09/2019   Hand swelling, left 03/09/2019   Tenosynovitis of left hand 03/09/2019    Past Surgical History:  Procedure Laterality Date   I & D EXTREMITY Left 03/09/2019   Procedure: IRRIGATION AND DEBRIDEMENT Left Hand;  Surgeon: Ernest Mallick, MD;  Location: [facility];  Service: Orthopedics;  Laterality: Left;       Home Medications    Prior to Admission medications   Medication Sig Start Date End Date Taking? Authorizing Provider  dicyclomine (BENTYL) 20 MG tablet Take 1 tablet (20 mg total) by mouth 4 (four) times daily as needed (intestinal cramps). 02/12/23  Yes Zenia Resides, MD  famotidine (PEPCID) 20 MG tablet Take 1 tablet (20 mg total) by mouth 2 (two) times daily. 02/12/23  Yes Zenia Resides, MD  lisinopril (ZESTRIL) 10 MG tablet Take 1 tablet (10 mg total) by mouth daily. 07/26/22  Yes Terrilee Files, MD  metFORMIN (GLUCOPHAGE) 1000 MG tablet Take 1 tablet  (1,000 mg total) by mouth 2 (two) times daily with a meal. 03/20/19  Yes Santos-Sanchez, Chelsea Primus, MD  blood glucose meter kit and supplies KIT Dispense based on patient and insurance preference. Use up to four times daily as directed. (FOR ICD-9 250.00, 250.01). 03/13/19   Gardenia Phlegm, MD    Family History Family History  Problem Relation Age of Onset   Diabetes Mother    Healthy Father    Diabetes Sister     Social History Social History   Tobacco Use   Smoking status: Every Day    Current packs/day: 1.00    Types: Cigarettes   Smokeless tobacco: Never   Tobacco comments:    1/3 pack per day   Vaping Use   Vaping status: Some Days   Substances: Nicotine, Flavoring  Substance Use Topics   Alcohol use: Yes   Drug use: Yes    Types: Marijuana     Allergies   Patient has no known allergies.   Review of Systems Review of Systems  Gastrointestinal:  Positive for abdominal pain and diarrhea.     Physical Exam Triage Vital Signs ED Triage Vitals  Encounter Vitals Group     BP 02/12/23 1220 (!) 170/86     Systolic BP Percentile --      Diastolic BP Percentile --  Pulse Rate 02/12/23 1220 77     Resp 02/12/23 1220 18     Temp 02/12/23 1220 97.6 F (36.4 C)     Temp Source 02/12/23 1220 Oral     SpO2 02/12/23 1220 98 %     Weight --      Height --      Head Circumference --      Peak Flow --      Pain Score 02/12/23 1219 7     Pain Loc --      Pain Education --      Exclude from Growth Chart --    No data found.  Updated Vital Signs BP (!) 170/86 (BP Location: Left Arm)   Pulse 77   Temp 97.6 F (36.4 C) (Oral)   Resp 18   SpO2 98%   Visual Acuity Right Eye Distance:   Left Eye Distance:   Bilateral Distance:    Right Eye Near:   Left Eye Near:    Bilateral Near:     Physical Exam Vitals reviewed.  Constitutional:      General: He is not in acute distress.    Appearance: He is not ill-appearing, toxic-appearing or diaphoretic.  HENT:      Mouth/Throat:     Mouth: Mucous membranes are moist.  Cardiovascular:     Rate and Rhythm: Normal rate and regular rhythm.     Heart sounds: No murmur heard. Pulmonary:     Effort: Pulmonary effort is normal.     Breath sounds: Normal breath sounds.  Abdominal:     General: There is no distension.     Palpations: Abdomen is soft.     Comments: There is mild generalized tenderness and it is a little more pronounced in the epigastric area.  Skin:    Coloration: Skin is not jaundiced or pale.  Neurological:     General: No focal deficit present.     Mental Status: He is alert and oriented to person, place, and time.  Psychiatric:        Behavior: Behavior normal.      UC Treatments / Results  Labs (all labs ordered are listed, but only abnormal results are displayed) Labs Reviewed - No data to display  EKG   Radiology No results found.  Procedures Procedures (including critical care time)  Medications Ordered in UC Medications - No data to display  Initial Impression / Assessment and Plan / UC Course  I have reviewed the triage vital signs and the nursing notes.  Pertinent labs & imaging results that were available during my care of the patient were reviewed by me and considered in my medical decision making (see chart for details).    Pepcid is sent in for possible reflux or gastritis as it is some Bentyl for symptom relief.  I mentioned considering doing some lab work for him, but he defers, as he feels that this is more related to a foodborne illness.   Final Clinical Impressions(s) / UC Diagnoses   Final diagnoses:  Epigastric pain     Discharge Instructions      Take famotidine 20 mg--1 tablet 2 times daily.  This is for stomach acid and acid reflux  Dicyclomine--take 1 every 6 hours as needed for intestinal cramps       ED Prescriptions     Medication Sig Dispense Auth. Provider   famotidine (PEPCID) 20 MG tablet Take 1 tablet (20 mg  total) by mouth 2 (two) times  daily. 30 tablet Audriella Blakeley, Janace Aris, MD   dicyclomine (BENTYL) 20 MG tablet Take 1 tablet (20 mg total) by mouth 4 (four) times daily as needed (intestinal cramps). 20 tablet Maegan Buller, Janace Aris, MD      PDMP not reviewed this encounter.   Zenia Resides, MD 02/12/23 [phone]

## 2023-02-12 NOTE — Discharge Instructions (Signed)
Take famotidine 20 mg--1 tablet 2 times daily.  This is for stomach acid and acid reflux  Dicyclomine--take 1 every 6 hours as needed for intestinal cramps

## 2023-05-11 ENCOUNTER — Other Ambulatory Visit: Payer: Self-pay

## 2023-05-11 ENCOUNTER — Encounter (HOSPITAL_COMMUNITY): Payer: Self-pay | Admitting: *Deleted

## 2023-05-11 ENCOUNTER — Ambulatory Visit (HOSPITAL_COMMUNITY)
Admission: EM | Admit: 2023-05-11 | Discharge: 2023-05-11 | Disposition: A | Discharge status: Home / Self Care | Payer: Self-pay | Attending: Family Medicine | Admitting: Family Medicine

## 2023-05-11 DIAGNOSIS — I10 Essential (primary) hypertension: Secondary | ICD-10-CM

## 2023-05-11 DIAGNOSIS — M25562 Pain in left knee: Secondary | ICD-10-CM

## 2023-05-11 LAB — POCT FASTING CBG KUC MANUAL ENTRY: POCT Glucose (KUC): 136 mg/dL — AB (ref 70–99)

## 2023-05-11 MED ORDER — ACETAMINOPHEN 325 MG PO TABS
975.0000 mg | ORAL_TABLET | Freq: Once | ORAL | Status: AC
  Administered 2023-05-11: 975 mg via ORAL

## 2023-05-11 MED ORDER — ACETAMINOPHEN 325 MG PO TABS
ORAL_TABLET | ORAL | Status: AC
  Filled 2023-05-11: qty 3

## 2023-05-11 NOTE — Discharge Instructions (Addendum)
Make sure to take your blood pressure medication today and daily there after. Not taking for a long time and having uncontrolled high blood pressure will injure your kidneys and is the most common cause to be on dialysis.  Check your blood pressure at any pharmacy 2 hours or more after taking your blood pressure medication and take those recording when you go see your primary care doctor in 1-2 weeks as you have stated.   Follow up with [facility] for your chronic knee pain  Do not take Ibuprofen, BC, Naprosyn, or Aleve since this affects your kidneys and blood pressure.  Stay on only Tylenol up to 1000 mg every 8 hours.

## 2023-05-11 NOTE — ED Triage Notes (Signed)
PT reports Lt knee pain for 3 days. Pt has been told he has a bone spur . Pt needs some thing for pain and a return to work note.

## 2023-05-11 NOTE — ED Notes (Signed)
CBG 136

## 2023-05-11 NOTE — ED Provider Notes (Signed)
[facility]    CSN: [id number] Arrival date & time: 05/11/23  1203      History   Chief Complaint Chief Complaint  Patient presents with   Knee Pain    HPI Carlos Charles is a 63 y.o. male who presents with recurrent L knee pain x 3 days. Has not injured his knee recently. Has not taken anything for pain. States he ran out of his BP med 2 days ago. Thinks he has PCP appt in 1-2 weeks.  Denies redness or swelling of his knee.  He also wants his sugar checked.   Past Medical History:  Diagnosis Date   Diabetes mellitus without complication (HCC)    Essential hypertension    Hyperlipidemia    Osteoarthritis of L hip     Patient Active Problem List   Diagnosis Date Noted   Type 2 diabetes mellitus (HCC) 03/09/2019   Hypertension 03/09/2019   Hand swelling, left 03/09/2019   Tenosynovitis of left hand 03/09/2019    Past Surgical History:  Procedure Laterality Date   I & D EXTREMITY Left 03/09/2019   Procedure: IRRIGATION AND DEBRIDEMENT Left Hand;  Surgeon: Ernest Mallick, MD;  Location: [facility];  Service: Orthopedics;  Laterality: Left;       Home Medications    Prior to Admission medications   Medication Sig Start Date End Date Taking? Authorizing Provider  blood glucose meter kit and supplies KIT Dispense based on patient and insurance preference. Use up to four times daily as directed. (FOR ICD-9 250.00, 250.01). 03/13/19  Yes Gardenia Phlegm, MD  lisinopril (ZESTRIL) 10 MG tablet Take 1 tablet (10 mg total) by mouth daily. 07/26/22  Yes Terrilee Files, MD  metFORMIN (GLUCOPHAGE) 1000 MG tablet Take 1 tablet (1,000 mg total) by mouth 2 (two) times daily with a meal. 03/20/19  Yes Santos-Sanchez, Chelsea Primus, MD  dicyclomine (BENTYL) 20 MG tablet Take 1 tablet (20 mg total) by mouth 4 (four) times daily as needed (intestinal cramps). 02/12/23   Zenia Resides, MD  famotidine (PEPCID) 20 MG tablet Take 1 tablet (20 mg total) by mouth 2 (two) times  daily. 02/12/23   Zenia Resides, MD    Family History Family History  Problem Relation Age of Onset   Diabetes Mother    Healthy Father    Diabetes Sister     Social History Social History   Tobacco Use   Smoking status: Every Day    Current packs/day: 1.00    Types: Cigarettes   Smokeless tobacco: Never   Tobacco comments:    1/3 pack per day   Vaping Use   Vaping status: Some Days   Substances: Nicotine, Flavoring  Substance Use Topics   Alcohol use: Yes   Drug use: Yes    Types: Marijuana     Allergies   Patient has no known allergies.   Review of Systems Review of Systems As noted in HPI He also denies dizziness, chest pains, edema, orthopnea, SOB, weakness, HA.   Physical Exam Triage Vital Signs ED Triage Vitals  Encounter Vitals Group     BP 05/11/23 1216 (!) 200/79     Systolic BP Percentile --      Diastolic BP Percentile --      Pulse Rate 05/11/23 1216 (!) 46     Resp 05/11/23 1216 18     Temp 05/11/23 1216 98.4 F (36.9 C)     Temp src --  SpO2 05/11/23 1216 99 %     Weight --      Height --      Head Circumference --      Peak Flow --      Pain Score 05/11/23 1215 8     Pain Loc --      Pain Education --      Exclude from Growth Chart --    No data found.  Updated Vital Signs BP (!) 192/102 (BP Location: Right Arm)   Pulse (!) 42   Temp 98.4 F (36.9 C)   Resp 18   SpO2 99%  Repeated BP prior to tylenol 191/84. P= 72 Visual Acuity Right Eye Distance:   Left Eye Distance:   Bilateral Distance:    Right Eye Near:   Left Eye Near:    Bilateral Near:     Physical Exam Vitals reviewed.  Constitutional:      General: He is not in acute distress.    Appearance: He is normal weight. He is not ill-appearing, toxic-appearing or diaphoretic.  HENT:     Right Ear: External ear normal.     Left Ear: External ear normal.  Eyes:     Conjunctiva/sclera: Conjunctivae normal.  Cardiovascular:     Heart sounds: No murmur  heard.    Comments: Manual rate count 72, has a few PVC's Pulmonary:     Effort: Pulmonary effort is normal.     Breath sounds: Normal breath sounds.  Musculoskeletal:        General: Tenderness present. No swelling, deformity or signs of injury.     Cervical back: Neck supple.     Right lower leg: No edema.     Left lower leg: No edema.     Comments: L KNEE- no effusion, has normal ROM. Has tenderness on medial joint line.   Skin:    General: Skin is warm and dry.     Findings: No rash.  Neurological:     Mental Status: He is alert and oriented to person, place, and time.     Gait: Gait normal.  Psychiatric:        Mood and Affect: Mood normal.        Behavior: Behavior normal.        Thought Content: Thought content normal.        Judgment: Judgment normal.      UC Treatments / Results  Labs (all labs ordered are listed, but only abnormal results are displayed) Labs Reviewed  POCT FASTING CBG [facility] MANUAL ENTRY - Abnormal; Notable for the following components:      Result Value   POCT Glucose ([facility]) 136 (*)    All other components within normal limits    EKG   Radiology No results found.  Procedures Procedures (including critical care time)  Medications Ordered in UC Medications  acetaminophen (TYLENOL) tablet 975 mg (975 mg Oral Given 05/11/23 1334)    Initial Impression / Assessment and Plan / UC Course  I have reviewed the triage vital signs and the nursing notes.  Chronic L knee pain, hx of OA Uncontrolled HTN  Pt is planning to pick up his BP med right now He was given Tylenol PO as noted for pain and his pain was reduced by 50%, advised to continue this. Needs to FU with ortho for his knee. I gave him a form to write his BP's and keep record to take to his PCP in case his dose needs to  be increased.   Final Clinical Impressions(s) / UC Diagnoses   Final diagnoses:  Acute pain of left knee  Uncontrolled hypertension     Discharge Instructions       Make sure to take your blood pressure medication today and daily there after. Not taking for a long time and having uncontrolled high blood pressure will injure your kidneys and is the most common cause to be on dialysis.  Check your blood pressure at any pharmacy 2 hours or more after taking your blood pressure medication and take those recording when you go see your primary care doctor in 1-2 weeks as you have stated.   Follow up with [facility] for your chronic knee pain  Do not take Ibuprofen, BC, Naprosyn, or Aleve since this affects your kidneys and blood pressure.  Stay on only Tylenol up to 1000 mg every 8 hours.      ED Prescriptions   None    I have reviewed the PDMP during this encounter.   Garey Ham, PA-C 05/11/23 1607

## 2023-06-02 NOTE — Addendum Note (Signed)
Encounter addended by: Lenice Llamas, CMA on: 06/02/2023 7:37 PM  Actions taken: Letter saved, [facility] navigator section filed

## 2023-12-03 ENCOUNTER — Encounter (HOSPITAL_COMMUNITY): Payer: Self-pay

## 2023-12-03 ENCOUNTER — Ambulatory Visit (INDEPENDENT_AMBULATORY_CARE_PROVIDER_SITE_OTHER): Payer: Self-pay

## 2023-12-03 ENCOUNTER — Ambulatory Visit (HOSPITAL_COMMUNITY)
Admission: EM | Admit: 2023-12-03 | Discharge: 2023-12-03 | Disposition: A | Discharge status: Home / Self Care | Payer: Self-pay | Attending: Emergency Medicine | Admitting: Emergency Medicine

## 2023-12-03 DIAGNOSIS — I10 Essential (primary) hypertension: Secondary | ICD-10-CM

## 2023-12-03 DIAGNOSIS — G8929 Other chronic pain: Secondary | ICD-10-CM

## 2023-12-03 DIAGNOSIS — M25562 Pain in left knee: Secondary | ICD-10-CM

## 2023-12-03 DIAGNOSIS — R202 Paresthesia of skin: Secondary | ICD-10-CM

## 2023-12-03 MED ORDER — MELOXICAM 15 MG PO TABS: 15.0000 mg | ORAL_TABLET | Freq: Every day | ORAL | 0 refills | Status: AC

## 2023-12-03 NOTE — Discharge Instructions (Addendum)
 Your x-ray does not reveal any underlying fracture or dislocation.  It does show some increased narrowing of the joint space which is consistent with arthritis. You can take meloxicam  once daily to help with this. Otherwise you can continue to take 500 mg of Tylenol  every 6-8 hours as needed for pain.  Do not exceed 4000 mg in 1 day. You can wear the knee brace for comfort and stabilization of the joint. Follow-up with [facility] for further evaluation and management of your knee pain. Otherwise please follow-up with the primary care provider that you are established with today in clinic for further evaluation of your arm numbness and elevated blood pressure today. You can also follow-up with the [facility] for your chronic concerns as well.

## 2023-12-03 NOTE — ED Triage Notes (Signed)
 Patient reports that he has had left knee pain x1 week. Patient denies any injury to the knee. Patient is currently walking with a cane which is new. Patient states he was told he needed a left hip replacement.  Patient also reports intermittent right hand numbness that radiates into the right shoulder.  Patient is also requesting an EKG. Patient denies SOB or chest pain. Patient states he was told he had an irregular heartbeat.

## 2023-12-03 NOTE — ED Provider Notes (Addendum)
 [facility]    CSN: [id number] Arrival date & time: 12/03/23  9180      History   Chief Complaint Chief Complaint  Patient presents with   Knee Pain   Numbness    HPI Carlos Charles is a 63 y.o. male.   Patient presents with left knee pain that has been bothering him for 1 week.  Patient states that he is having difficulty walking due to left knee pain and is having to use a cane in order to walk.  Patient does have a history of left knee pain, reports that it will flare every once in a while.  Patient has been told that he needs a left hip replacement, denies any hip pain today.  Patient states he has been taking Tylenol  and Advil  with minimal relief of this.  Patient states that about 2 weeks ago he began to have some intermittent right hand numbness that radiates up into his right shoulder.  Patient states that he notices this most at night when he is laying down.  Patient denies any pain to his shoulder or hand.  Patient denies chest pain, shortness of breath, and palpitations.   Patient is requesting an EKG.  Patient states that he went to a donation center and was told that he had an irregular heartbeat and therefore he could not donate.  Patient states that he wants a note saying that his EKG is fine so that he can donate.  Patient states that he does not currently have a primary care doctor.  Patient states that he currently does not have any insurance as well.  Patient states that he did file for disability a few weeks ago, and is asking if we can assist with this.  The history is provided by the patient and medical records.  Knee Pain   Past Medical History:  Diagnosis Date   Diabetes mellitus without complication (HCC)    Essential hypertension    Hyperlipidemia    Osteoarthritis of L hip     Patient Active Problem List   Diagnosis Date Noted   Type 2 diabetes mellitus (HCC) 03/09/2019   Hypertension 03/09/2019   Hand swelling, left 03/09/2019    Tenosynovitis of left hand 03/09/2019    Past Surgical History:  Procedure Laterality Date   I & D EXTREMITY Left 03/09/2019   Procedure: IRRIGATION AND DEBRIDEMENT Left Hand;  Surgeon: Carolee Lynwood JINNY DOUGLAS, MD;  Location: [facility];  Service: Orthopedics;  Laterality: Left;       Home Medications    Prior to Admission medications   Medication Sig Start Date End Date Taking? Authorizing Provider  meloxicam  (MOBIC ) 15 MG tablet Take 1 tablet (15 mg total) by mouth daily. 12/03/23  Yes Johnie Flaming A, NP  blood glucose meter kit and supplies KIT Dispense based on patient and insurance preference. Use up to four times daily as directed. (FOR ICD-9 250.00, 250.01). 03/13/19   Golda Lynwood JINNY, MD  lisinopril  (ZESTRIL ) 10 MG tablet Take 1 tablet (10 mg total) by mouth daily. 07/26/22   Towana Ozell BROCKS, MD  metFORMIN  (GLUCOPHAGE ) 1000 MG tablet Take 1 tablet (1,000 mg total) by mouth 2 (two) times daily with a meal. 03/20/19   Ruthann Basket, MD    Family History Family History  Problem Relation Age of Onset   Diabetes Mother    Healthy Father    Diabetes Sister     Social History Social History   Tobacco Use   Smoking  status: Every Day    Current packs/day: 1.00    Types: Cigarettes   Smokeless tobacco: Never   Tobacco comments:    1/3 pack per day   Vaping Use   Vaping status: Never Used  Substance Use Topics   Alcohol use: Yes   Drug use: Not Currently    Types: Marijuana     Allergies   Patient has no known allergies.   Review of Systems Review of Systems  Per HPI  Physical Exam Triage Vital Signs ED Triage Vitals  Encounter Vitals Group     BP 12/03/23 0900 (!) 178/84     Girls Systolic BP Percentile --      Girls Diastolic BP Percentile --      Boys Systolic BP Percentile --      Boys Diastolic BP Percentile --      Pulse Rate 12/03/23 0858 77     Resp 12/03/23 0858 16     Temp 12/03/23 0858 98.2 F (36.8 C)     Temp Source 12/03/23 0858  Oral     SpO2 12/03/23 0858 100 %     Weight --      Height --      Head Circumference --      Peak Flow --      Pain Score 12/03/23 0900 10     Pain Loc --      Pain Education --      Exclude from Growth Chart --    No data found.  Updated Vital Signs BP (!) 178/84 (BP Location: Right Arm)   Pulse 77   Temp 98.2 F (36.8 C) (Oral)   Resp 16   SpO2 100%   Visual Acuity Right Eye Distance:   Left Eye Distance:   Bilateral Distance:    Right Eye Near:   Left Eye Near:    Bilateral Near:     Physical Exam Vitals and nursing note reviewed.  Constitutional:      General: He is awake. He is not in acute distress.    Appearance: Normal appearance. He is well-developed and well-groomed. He is not ill-appearing.  Cardiovascular:     Rate and Rhythm: Normal rate and regular rhythm.  Pulmonary:     Effort: Pulmonary effort is normal.     Breath sounds: Normal breath sounds.  Musculoskeletal:     Right shoulder: No swelling, deformity, tenderness or bony tenderness. Normal range of motion. Normal strength. Normal pulse.     Right upper arm: Normal.     Right elbow: Normal.     Right forearm: Normal.     Right wrist: Normal.     Right hand: Normal. No swelling, deformity, tenderness or bony tenderness. Normal range of motion. Normal strength. Normal sensation. Normal capillary refill. Normal pulse.     Left knee: No swelling or deformity. Normal range of motion. Tenderness present over the medial joint line and lateral joint line.  Skin:    General: Skin is warm and dry.  Neurological:     Mental Status: He is alert.  Psychiatric:        Behavior: Behavior is cooperative.      UC Treatments / Results  Labs (all labs ordered are listed, but only abnormal results are displayed) Labs Reviewed - No data to display  EKG   Radiology DG Knee Complete 4 Views Left Result Date: 12/03/2023 CLINICAL DATA:  left knee pain EXAM: LEFT KNEE - COMPLETE 4+ VIEW COMPARISON:  11/10/2022 FINDINGS: No evidence of fracture, dislocation, or joint effusion. Stable serpiginous sclerosis in the distal femoral metaphysis without associated aggressive features, favored to reflect osteonecrosis. Mild medial femorotibial compartment joint space narrowing. Soft tissues are unremarkable. IMPRESSION: 1. No acute osseous abnormality. 2. Mild medial femorotibial compartment joint space narrowing. 3. Stable serpiginous sclerosis in the distal femoral metaphysis without associated aggressive features, favored to reflect osteonecrosis. Electronically Signed   By: Harrietta Sherry M.D.   On: 12/03/2023 10:46    Procedures Procedures (including critical care time)  Medications Ordered in UC Medications - No data to display  Initial Impression / Assessment and Plan / UC Course  I have reviewed the triage vital signs and the nursing notes.  Pertinent labs & imaging results that were available during my care of the patient were reviewed by me and considered in my medical decision making (see chart for details).     Patient is overall well-appearing.  Vitals are stable.  EKG performed per patient request.  EKG does not reveal any acute ST elevation, depression, or acute cardiac findings.  Knee pain Knee x-ray ordered.  Patient interpretation there is no acute osseous abnormality there are worsening degenerative changes and joint space narrowing noted on x-ray.  Radiology report confirms this.  Provided patient with knee brace.  Prescribed meloxicam  as needed for knee pain.  Recommended alternating this with Tylenol  as needed.  Given orthopedic follow-up.  2. Paresthesia Recommended following up with primary care provider that they were established with today in clinic or [place] [facility].  3.  Blood pressure Blood pressure is elevated at 178/84 in clinic today.  Denies any symptoms related to this.  Patient states that he has been off of blood pressure  medications for years and would not like to be restarted on them today.  Recommended patient follow-up with primary care provider that he was established with today or [facility] for this. Final Clinical Impressions(s) / UC Diagnoses   Final diagnoses:  Chronic pain of left knee  Paresthesia  Elevated blood pressure reading in office with diagnosis of hypertension     Discharge Instructions      Your x-ray does not reveal any underlying fracture or dislocation.  It does show some increased narrowing of the joint space which is consistent with arthritis. You can take meloxicam  once daily to help with this. Otherwise you can continue to take 500 mg of Tylenol  every 6-8 hours as needed for pain.  Do not exceed 4000 mg in 1 day. You can wear the knee brace for comfort and stabilization of the joint. Follow-up with [facility] for further evaluation and management of your knee pain. Otherwise please follow-up with the primary care provider that you are established with today in clinic for further evaluation of your arm numbness and elevated blood pressure today. You can also follow-up with the [facility] for your chronic concerns as well.     ED Prescriptions     Medication Sig Dispense Auth. Provider   meloxicam  (MOBIC ) 15 MG tablet Take 1 tablet (15 mg total) by mouth daily. 30 tablet Johnie Flaming A, NP      PDMP not reviewed this encounter.   Johnie Flaming LABOR, NP 12/03/23 1124    Johnie Flaming A, NP 12/03/23 [phone]

## 2023-12-23 ENCOUNTER — Emergency Department (HOSPITAL_COMMUNITY): Payer: Self-pay

## 2023-12-23 ENCOUNTER — Other Ambulatory Visit: Payer: Self-pay

## 2023-12-23 ENCOUNTER — Inpatient Hospital Stay (HOSPITAL_COMMUNITY)
Admission: EM | Admit: 2023-12-23 | Discharge: 2023-12-26 | DRG: 281 | Disposition: A | Discharge status: Home / Self Care | Payer: Self-pay | Attending: Hospitalist | Admitting: Hospitalist

## 2023-12-23 DIAGNOSIS — E7800 Pure hypercholesterolemia, unspecified: Secondary | ICD-10-CM | POA: Diagnosis present

## 2023-12-23 DIAGNOSIS — M25511 Pain in right shoulder: Secondary | ICD-10-CM | POA: Diagnosis present

## 2023-12-23 DIAGNOSIS — F1910 Other psychoactive substance abuse, uncomplicated: Secondary | ICD-10-CM | POA: Diagnosis present

## 2023-12-23 DIAGNOSIS — Z7984 Long term (current) use of oral hypoglycemic drugs: Secondary | ICD-10-CM

## 2023-12-23 DIAGNOSIS — Z7982 Long term (current) use of aspirin: Secondary | ICD-10-CM

## 2023-12-23 DIAGNOSIS — F1410 Cocaine abuse, uncomplicated: Secondary | ICD-10-CM | POA: Diagnosis present

## 2023-12-23 DIAGNOSIS — Z833 Family history of diabetes mellitus: Secondary | ICD-10-CM

## 2023-12-23 DIAGNOSIS — E871 Hypo-osmolality and hyponatremia: Secondary | ICD-10-CM | POA: Diagnosis present

## 2023-12-23 DIAGNOSIS — M160 Bilateral primary osteoarthritis of hip: Secondary | ICD-10-CM | POA: Diagnosis present

## 2023-12-23 DIAGNOSIS — Z7151 Drug abuse counseling and surveillance of drug abuser: Secondary | ICD-10-CM

## 2023-12-23 DIAGNOSIS — G8929 Other chronic pain: Secondary | ICD-10-CM | POA: Diagnosis present

## 2023-12-23 DIAGNOSIS — F1490 Cocaine use, unspecified, uncomplicated: Secondary | ICD-10-CM | POA: Diagnosis present

## 2023-12-23 DIAGNOSIS — E1151 Type 2 diabetes mellitus with diabetic peripheral angiopathy without gangrene: Secondary | ICD-10-CM | POA: Diagnosis present

## 2023-12-23 DIAGNOSIS — M879 Osteonecrosis, unspecified: Secondary | ICD-10-CM | POA: Diagnosis present

## 2023-12-23 DIAGNOSIS — Z791 Long term (current) use of non-steroidal anti-inflammatories (NSAID): Secondary | ICD-10-CM

## 2023-12-23 DIAGNOSIS — F17210 Nicotine dependence, cigarettes, uncomplicated: Secondary | ICD-10-CM | POA: Diagnosis present

## 2023-12-23 DIAGNOSIS — Z91148 Patient's other noncompliance with medication regimen for other reason: Secondary | ICD-10-CM

## 2023-12-23 DIAGNOSIS — I200 Unstable angina: Principal | ICD-10-CM

## 2023-12-23 DIAGNOSIS — E119 Type 2 diabetes mellitus without complications: Secondary | ICD-10-CM

## 2023-12-23 DIAGNOSIS — F1010 Alcohol abuse, uncomplicated: Secondary | ICD-10-CM | POA: Diagnosis present

## 2023-12-23 DIAGNOSIS — I493 Ventricular premature depolarization: Secondary | ICD-10-CM | POA: Diagnosis present

## 2023-12-23 DIAGNOSIS — M87051 Idiopathic aseptic necrosis of right femur: Secondary | ICD-10-CM

## 2023-12-23 DIAGNOSIS — Z79899 Other long term (current) drug therapy: Secondary | ICD-10-CM

## 2023-12-23 DIAGNOSIS — I10 Essential (primary) hypertension: Secondary | ICD-10-CM | POA: Diagnosis present

## 2023-12-23 DIAGNOSIS — Z716 Tobacco abuse counseling: Secondary | ICD-10-CM

## 2023-12-23 DIAGNOSIS — I739 Peripheral vascular disease, unspecified: Secondary | ICD-10-CM

## 2023-12-23 DIAGNOSIS — I214 Non-ST elevation (NSTEMI) myocardial infarction: Principal | ICD-10-CM | POA: Diagnosis present

## 2023-12-23 LAB — BASIC METABOLIC PANEL WITH GFR
Anion gap: 15 (ref 5–15)
BUN: 13 mg/dL (ref 8–23)
CO2: 20 mmol/L — ABNORMAL LOW (ref 22–32)
Calcium: 9.2 mg/dL (ref 8.9–10.3)
Chloride: 98 mmol/L (ref 98–111)
Creatinine, Ser: 1.25 mg/dL — ABNORMAL HIGH (ref 0.61–1.24)
GFR, Estimated: >60 mL/min (ref >60)
Glucose, Bld: 247 mg/dL — ABNORMAL HIGH (ref 70–99)
Potassium: 3.9 mmol/L (ref 3.5–5.1)
Sodium: 133 mmol/L — ABNORMAL LOW (ref 135–145)

## 2023-12-23 LAB — CBC
HCT: 43.6 % (ref 39.0–52.0)
Hemoglobin: 14.8 g/dL (ref 13.0–17.0)
MCH: 32 pg (ref 26.0–34.0)
MCHC: 33.9 g/dL (ref 30.0–36.0)
MCV: 94.4 fL (ref 80.0–100.0)
Platelets: 253 K/uL (ref 150–400)
RBC: 4.62 MIL/uL (ref 4.22–5.81)
RDW: 12.6 % (ref 11.5–15.5)
WBC: 11 K/uL — ABNORMAL HIGH (ref 4.0–10.5)
nRBC: 0 % (ref 0.0–0.2)

## 2023-12-23 LAB — TROPONIN I (HIGH SENSITIVITY): Troponin I (High Sensitivity): 341 ng/L (ref <18)

## 2023-12-23 MED ORDER — HEPARIN BOLUS VIA INFUSION
4000.0000 [IU] | Freq: Once | INTRAVENOUS | Status: AC
  Administered 2023-12-24: 4000 [IU] via INTRAVENOUS
  Filled 2023-12-23: qty 4000

## 2023-12-23 MED ORDER — HEPARIN (PORCINE) 25000 UT/250ML-% IV SOLN
1000.0000 [IU]/h | INTRAVENOUS | Status: DC
  Administered 2023-12-24: 1000 [IU]/h via INTRAVENOUS
  Filled 2023-12-23: qty 250

## 2023-12-23 MED ORDER — ASPIRIN 81 MG PO CHEW
324.0000 mg | CHEWABLE_TABLET | Freq: Once | ORAL | Status: AC
  Administered 2023-12-23: 324 mg via ORAL
  Filled 2023-12-23: qty 4

## 2023-12-23 MED ORDER — ONDANSETRON HCL 4 MG/2ML IJ SOLN
4.0000 mg | Freq: Once | INTRAMUSCULAR | Status: AC
  Administered 2023-12-23: 4 mg via INTRAVENOUS
  Filled 2023-12-23: qty 2

## 2023-12-23 NOTE — ED Triage Notes (Signed)
 Pt [facility], c/o chest pain, weakness, left hip pain , states that his leg gave out, started today. Pt stated that he fell, denies hitting his head or LOC. A/O x4   VSS CBG 161 650mg  tylenol  and hot pack

## 2023-12-23 NOTE — ED Notes (Signed)
 Light blue top collected and sent

## 2023-12-24 ENCOUNTER — Inpatient Hospital Stay (HOSPITAL_COMMUNITY): Payer: MEDICAID

## 2023-12-24 ENCOUNTER — Other Ambulatory Visit (HOSPITAL_COMMUNITY): Payer: Self-pay

## 2023-12-24 ENCOUNTER — Other Ambulatory Visit: Payer: Self-pay

## 2023-12-24 ENCOUNTER — Encounter (HOSPITAL_COMMUNITY): Payer: Self-pay

## 2023-12-24 ENCOUNTER — Telehealth (HOSPITAL_COMMUNITY): Payer: Self-pay | Admitting: Pharmacy Technician

## 2023-12-24 ENCOUNTER — Encounter (HOSPITAL_COMMUNITY)
Admission: EM | Disposition: A | Discharge status: Home / Self Care | Payer: Self-pay | Source: Home / Self Care | Attending: Hospitalist

## 2023-12-24 ENCOUNTER — Encounter (HOSPITAL_COMMUNITY): Payer: Self-pay | Admitting: Internal Medicine

## 2023-12-24 DIAGNOSIS — E119 Type 2 diabetes mellitus without complications: Secondary | ICD-10-CM

## 2023-12-24 DIAGNOSIS — I739 Peripheral vascular disease, unspecified: Secondary | ICD-10-CM

## 2023-12-24 DIAGNOSIS — I214 Non-ST elevation (NSTEMI) myocardial infarction: Secondary | ICD-10-CM

## 2023-12-24 DIAGNOSIS — F1490 Cocaine use, unspecified, uncomplicated: Secondary | ICD-10-CM

## 2023-12-24 DIAGNOSIS — I70213 Atherosclerosis of native arteries of extremities with intermittent claudication, bilateral legs: Secondary | ICD-10-CM

## 2023-12-24 DIAGNOSIS — F1010 Alcohol abuse, uncomplicated: Secondary | ICD-10-CM

## 2023-12-24 DIAGNOSIS — Z794 Long term (current) use of insulin: Secondary | ICD-10-CM

## 2023-12-24 DIAGNOSIS — R079 Chest pain, unspecified: Secondary | ICD-10-CM

## 2023-12-24 DIAGNOSIS — I10 Essential (primary) hypertension: Secondary | ICD-10-CM

## 2023-12-24 LAB — RAPID URINE DRUG SCREEN, HOSP PERFORMED
Amphetamines: NOT DETECTED
Barbiturates: NOT DETECTED
Benzodiazepines: NOT DETECTED
Cocaine: POSITIVE — AB
Opiates: NOT DETECTED
Tetrahydrocannabinol: NOT DETECTED

## 2023-12-24 LAB — ECHOCARDIOGRAM COMPLETE
AR max vel: 2.1 cm2
AV Area VTI: 2.19 cm2
AV Area mean vel: 2.33 cm2
AV Mean grad: 5 mmHg
AV Peak grad: 9.6 mmHg
Ao pk vel: 1.55 m/s
Height: 71 in
S' Lateral: 4.6 cm
Weight: 2571.45 [oz_av]

## 2023-12-24 LAB — COMPREHENSIVE METABOLIC PANEL WITH GFR
ALT: 13 U/L (ref 0–44)
AST: 16 U/L (ref 15–41)
Albumin: 3.3 g/dL — ABNORMAL LOW (ref 3.5–5.0)
Alkaline Phosphatase: 71 U/L (ref 38–126)
Anion gap: 10 (ref 5–15)
BUN: 13 mg/dL (ref 8–23)
CO2: 23 mmol/L (ref 22–32)
Calcium: 8.9 mg/dL (ref 8.9–10.3)
Chloride: 101 mmol/L (ref 98–111)
Creatinine, Ser: 1.06 mg/dL (ref 0.61–1.24)
GFR, Estimated: >60 mL/min (ref >60)
Glucose, Bld: 134 mg/dL — ABNORMAL HIGH (ref 70–99)
Potassium: 3.8 mmol/L (ref 3.5–5.1)
Sodium: 134 mmol/L — ABNORMAL LOW (ref 135–145)
Total Bilirubin: 0.6 mg/dL (ref 0.0–1.2)
Total Protein: 7.2 g/dL (ref 6.5–8.1)

## 2023-12-24 LAB — LIPID PANEL
Cholesterol: 183 mg/dL (ref 0–200)
HDL: 57 mg/dL (ref >40)
LDL Cholesterol: 112 mg/dL — ABNORMAL HIGH (ref 0–99)
Total CHOL/HDL Ratio: 3.2 ratio
Triglycerides: 69 mg/dL (ref <150)
VLDL: 14 mg/dL (ref 0–40)

## 2023-12-24 LAB — HEMOGLOBIN A1C
Hgb A1c MFr Bld: 6.7 % — ABNORMAL HIGH (ref 4.8–5.6)
Mean Plasma Glucose: 145.59 mg/dL

## 2023-12-24 LAB — GLUCOSE, CAPILLARY
Glucose-Capillary: 158 mg/dL — ABNORMAL HIGH (ref 70–99)
Glucose-Capillary: 144 mg/dL — ABNORMAL HIGH (ref 70–99)
Glucose-Capillary: 354 mg/dL — ABNORMAL HIGH (ref 70–99)
Glucose-Capillary: 437 mg/dL — ABNORMAL HIGH (ref 70–99)
Glucose-Capillary: 285 mg/dL — ABNORMAL HIGH (ref 70–99)
Glucose-Capillary: 158 mg/dL — ABNORMAL HIGH (ref 70–99)
Glucose-Capillary: 137 mg/dL — ABNORMAL HIGH (ref 70–99)

## 2023-12-24 LAB — HEPATIC FUNCTION PANEL
ALT: 14 U/L (ref 0–44)
AST: 18 U/L (ref 15–41)
Albumin: 3.3 g/dL — ABNORMAL LOW (ref 3.5–5.0)
Alkaline Phosphatase: 72 U/L (ref 38–126)
Bilirubin, Direct: <0.1 mg/dL (ref 0.0–0.2)
Total Bilirubin: 0.4 mg/dL (ref 0.0–1.2)
Total Protein: 7.2 g/dL (ref 6.5–8.1)

## 2023-12-24 LAB — CBC
HCT: 41.8 % (ref 39.0–52.0)
Hemoglobin: 14.2 g/dL (ref 13.0–17.0)
MCH: 31.6 pg (ref 26.0–34.0)
MCHC: 34 g/dL (ref 30.0–36.0)
MCV: 93.1 fL (ref 80.0–100.0)
Platelets: 248 K/uL (ref 150–400)
RBC: 4.49 MIL/uL (ref 4.22–5.81)
RDW: 12.6 % (ref 11.5–15.5)
WBC: 10 K/uL (ref 4.0–10.5)
nRBC: 0 % (ref 0.0–0.2)

## 2023-12-24 LAB — TROPONIN I (HIGH SENSITIVITY): Troponin I (High Sensitivity): 320 ng/L (ref <18)

## 2023-12-24 LAB — TSH: TSH: 1.958 u[IU]/mL (ref 0.350–4.500)

## 2023-12-24 LAB — SURGICAL PCR SCREEN
MRSA, PCR: NEGATIVE
Staphylococcus aureus: NEGATIVE

## 2023-12-24 LAB — HIV ANTIBODY (ROUTINE TESTING W REFLEX): HIV Screen 4th Generation wRfx: NONREACTIVE

## 2023-12-24 SURGERY — LEFT HEART CATH AND CORONARY ANGIOGRAPHY
Anesthesia: LOCAL

## 2023-12-24 MED ORDER — THIAMINE HCL 100 MG/ML IJ SOLN
100.0000 mg | Freq: Every day | INTRAMUSCULAR | Status: DC
  Filled 2023-12-24: qty 2

## 2023-12-24 MED ORDER — SODIUM CHLORIDE 0.9 % IV SOLN: INTRAVENOUS | Status: AC

## 2023-12-24 MED ORDER — ONDANSETRON HCL 4 MG/2ML IJ SOLN
4.0000 mg | Freq: Four times a day (QID) | INTRAMUSCULAR | Status: DC | PRN
Start: 2023-12-24 — End: 2023-12-26

## 2023-12-24 MED ORDER — CLOPIDOGREL BISULFATE 75 MG PO TABS
75.0000 mg | ORAL_TABLET | Freq: Every day | ORAL | Status: DC
  Administered 2023-12-24 – 2023-12-26 (×3): 75 mg via ORAL
  Filled 2023-12-24 (×3): qty 1

## 2023-12-24 MED ORDER — HEPARIN SODIUM (PORCINE) 5000 UNIT/ML IJ SOLN
5000.0000 [IU] | Freq: Three times a day (TID) | INTRAMUSCULAR | Status: DC
  Administered 2023-12-24 – 2023-12-25 (×3): 5000 [IU] via SUBCUTANEOUS
  Filled 2023-12-24 (×5): qty 1

## 2023-12-24 MED ORDER — LORAZEPAM 2 MG/ML IJ SOLN: 1.0000 mg | INTRAMUSCULAR | Status: DC | PRN

## 2023-12-24 MED ORDER — PROPRANOLOL HCL 20 MG PO TABS
20.0000 mg | ORAL_TABLET | Freq: Three times a day (TID) | ORAL | Status: DC
  Administered 2023-12-24 – 2023-12-25 (×6): 20 mg via ORAL
  Filled 2023-12-24 (×9): qty 1

## 2023-12-24 MED ORDER — LOSARTAN POTASSIUM 25 MG PO TABS
25.0000 mg | ORAL_TABLET | Freq: Every day | ORAL | Status: DC
  Administered 2023-12-24 – 2023-12-26 (×3): 25 mg via ORAL
  Filled 2023-12-24 (×3): qty 1

## 2023-12-24 MED ORDER — ASPIRIN 81 MG PO TBEC
81.0000 mg | DELAYED_RELEASE_TABLET | Freq: Every day | ORAL | Status: DC
  Administered 2023-12-24 – 2023-12-26 (×3): 81 mg via ORAL
  Filled 2023-12-24 (×3): qty 1

## 2023-12-24 MED ORDER — INSULIN ASPART 100 UNIT/ML IJ SOLN
0.0000 [IU] | Freq: Three times a day (TID) | INTRAMUSCULAR | Status: DC
  Administered 2023-12-24: 15 [IU] via SUBCUTANEOUS
  Administered 2023-12-24 (×2): 3 [IU] via SUBCUTANEOUS
  Administered 2023-12-25: 2 [IU] via SUBCUTANEOUS

## 2023-12-24 MED ORDER — ACETAMINOPHEN 325 MG PO TABS
650.0000 mg | ORAL_TABLET | ORAL | Status: DC | PRN
Start: 2023-12-24 — End: 2023-12-26
  Administered 2023-12-24 (×3): 650 mg via ORAL
  Filled 2023-12-24 (×3): qty 2

## 2023-12-24 MED ORDER — FOLIC ACID 1 MG PO TABS
1.0000 mg | ORAL_TABLET | Freq: Every day | ORAL | Status: DC
  Administered 2023-12-24 – 2023-12-26 (×3): 1 mg via ORAL
  Filled 2023-12-24 (×3): qty 1

## 2023-12-24 MED ORDER — NITROGLYCERIN 0.4 MG SL SUBL: 0.4000 mg | SUBLINGUAL_TABLET | SUBLINGUAL | Status: DC | PRN

## 2023-12-24 MED ORDER — THIAMINE MONONITRATE 100 MG PO TABS
100.0000 mg | ORAL_TABLET | Freq: Every day | ORAL | Status: DC
  Administered 2023-12-24 – 2023-12-26 (×3): 100 mg via ORAL
  Filled 2023-12-24 (×3): qty 1

## 2023-12-24 MED ORDER — EMPAGLIFLOZIN 10 MG PO TABS
10.0000 mg | ORAL_TABLET | Freq: Every day | ORAL | Status: DC
  Administered 2023-12-24 – 2023-12-26 (×3): 10 mg via ORAL
  Filled 2023-12-24 (×3): qty 1

## 2023-12-24 MED ORDER — ATORVASTATIN CALCIUM 80 MG PO TABS
80.0000 mg | ORAL_TABLET | Freq: Every day | ORAL | Status: DC
  Administered 2023-12-24 – 2023-12-26 (×3): 80 mg via ORAL
  Filled 2023-12-24 (×3): qty 1

## 2023-12-24 MED ORDER — ADULT MULTIVITAMIN W/MINERALS CH
1.0000 | ORAL_TABLET | Freq: Every day | ORAL | Status: DC
  Administered 2023-12-24 – 2023-12-26 (×3): 1 via ORAL
  Filled 2023-12-24 (×3): qty 1

## 2023-12-24 MED ORDER — LORAZEPAM 1 MG PO TABS: 1.0000 mg | ORAL_TABLET | ORAL | Status: DC | PRN

## 2023-12-24 MED ORDER — PERFLUTREN LIPID MICROSPHERE
1.0000 mL | INTRAVENOUS | Status: AC | PRN
  Administered 2023-12-24: 4 mL via INTRAVENOUS

## 2023-12-24 MED ORDER — NICOTINE 14 MG/24HR TD PT24: 14.0000 mg | MEDICATED_PATCH | Freq: Every day | TRANSDERMAL | Status: DC

## 2023-12-24 NOTE — Progress Notes (Addendum)
 Patient Name: Carlos Charles Date of Encounter: 12/24/2023 [facility] Cardiologist: None  12/23/2023 .admit Length of stay: 0  Interval Summary  .    Carlos Charles is a 63 y.o. African-American male patient who has diabetes, hypertension, hypercholesterolemia, last incarcerated in 2022 and has been taking occasional medications and checks his blood sugar and states that it is well-controlled, presented to the emergency room with chest pain with radiation to his shoulders and upper arms.  He was found to have markedly abnormal EKG suggestive of anterolateral ischemia versus subendocardial infarct on top of LVH with repolarization abnormality due to hypertension compared to prior EKGs.  He was admitted for further evaluation.  Patient smokes cocaine once every 2 weeks, drinks about 5-6 beers a day, smokes about a pack of cigarettes a day has been disabled due to inability to stand from bilateral hip and leg weakness, states that his legs hurt even walking minimal distances and hence he lost his job.  Physical Exam    Vitals:   12/24/23 0117 12/24/23 0302 12/24/23 0600 12/24/23 0823  BP: (!) 167/74 (!) 146/81  123/72  Pulse: 81 68 72 77  Resp: 20 19    Temp: 99.1 F (37.3 C) 99 F (37.2 C)  99 F (37.2 C)  TempSrc: Oral Oral  Oral  SpO2: 98% 93%  96%  Weight: 72.9 kg     Height: 5' 11 (1.803 m)      Physical Exam Neck:     Vascular: No carotid bruit or JVD.  Cardiovascular:     Rate and Rhythm: Normal rate and regular rhythm.     Pulses: Intact distal pulses.          Carotid pulses are 2+ on the right side and 2+ on the left side.      Femoral pulses are 1+ on the right side and 1+ on the left side.      Popliteal pulses are 0 on the right side and 0 on the left side.       Dorsalis pedis pulses are 0 on the right side and 0 on the left side.       Posterior tibial pulses are 0 on the right side and 0 on the left side.     Heart sounds: Murmur heard.     Blowing mid  to late systolic murmur is present with a grade of 2/6 at the apex.     No gallop.  Pulmonary:     Effort: Pulmonary effort is normal.     Breath sounds: Normal breath sounds.  Abdominal:     General: Bowel sounds are normal.     Palpations: Abdomen is soft.  Musculoskeletal:     Right lower leg: No edema.     Left lower leg: No edema.        12/24/2023    1:17 AM 12/23/2023    9:29 PM 12/24/2022    5:40 PM  Last 3 Weights  Weight (lbs) 160 lb 11.5 oz 184 lb 180 lb  Weight (kg) 72.9 kg 83.462 kg 81.647 kg      Labs   Lab Results  Component Value Date   NA 134 (L) 12/24/2023   K 3.8 12/24/2023   CO2 23 12/24/2023   GLUCOSE 134 (H) 12/24/2023   BUN 13 12/24/2023   CREATININE 1.06 12/24/2023   CALCIUM  8.9 12/24/2023   GFRNONAA >60 12/24/2023       Latest Ref Rng & Units 12/24/2023  2:58 AM 12/23/2023    9:31 PM 07/26/2022    4:58 PM  BMP  Glucose 70 - 99 mg/dL 865  752  860   BUN 8 - 23 mg/dL 13  13  10    Creatinine 0.61 - 1.24 mg/dL 8.93  8.74  9.33   Sodium 135 - 145 mmol/L 134  133  140   Potassium 3.5 - 5.1 mmol/L 3.8  3.9  3.2   Chloride 98 - 111 mmol/L 101  98  109   CO2 22 - 32 mmol/L 23  20  18    Calcium  8.9 - 10.3 mg/dL 8.9  9.2  7.5        Latest Ref Rng & Units 12/24/2023    2:58 AM 12/23/2023    9:31 PM 07/26/2022    4:58 PM  CBC  WBC 4.0 - 10.5 K/uL 10.0  11.0  5.6   Hemoglobin 13.0 - 17.0 g/dL 85.7  85.1  88.6   Hematocrit 39.0 - 52.0 % 41.8  43.6  33.6   Platelets 150 - 400 K/uL 248  253  196     Lab Results  Component Value Date   CHOL 183 12/24/2023   HDL 57 12/24/2023   LDLCALC 112 (H) 12/24/2023   TRIG 69 12/24/2023   CHOLHDL 3.2 12/24/2023    Lab Results  Component Value Date   TSH 1.958 12/24/2023    Lab Results  Component Value Date   HGBA1C 6.7 (H) 12/24/2023    Cardiac Panel (last 3 results) Recent Labs    12/23/23 2131 12/23/23 2357  TROPONINIHS 341* 320*   Intake/Output Summary (Last 24 hours) at 12/24/2023 0854 Last  data filed at 12/24/2023 9360 Gross per 24 hour  Intake 120.68 ml  Output --  Net 120.68 ml    Net IO Since Admission: 120.68 mL [12/24/23 0854]  Tele/EKG/Cardiac studies    Telemetry: Sinus rhythm, frequent PACs and occasional PVCs.  EKG:  EKG 12/24/2023 at 8:21 AM: Sinus rhythm with short PR interval at rate of 76 bpm, normal axis, LVH with repolarization abnormality, cannot exclude anterior subendocardial infarct.  Frequent PACs.  Compared to EKG from 6:45 AM, T wave inversion progressed in V1 and V2, PACs more frequent.  Compared to 12/23/2023, T wave abnormality in anterior leads is new.  Recent Results (from the past 56199 hours)  ECHOCARDIOGRAM COMPLETE   Collection Time: 12/24/23  8:49 AM  Result Value   Weight 2,571.45   Height 71   BP 123/72   *Note: Due to a large number of results and/or encounters for the requested time period, some results have not been displayed. A complete set of results can be found in Results Review.   Echocardiogram reviewed personally by me Global hypokinesis, mild LVH, no significant valvular abnormality.  LVEF 45%      Radiology   Imaging results have been reviewed  Current Meds:     Current Facility-Administered Medications:    0.9 %  sodium chloride  infusion, , Intravenous, Continuous, Verdene Purchase, MD, Last Rate: 75 mL/hr at 12/24/23 0314, New Bag at 12/24/23 0314   acetaminophen  (TYLENOL ) tablet 650 mg, 650 mg, Oral, Q4H PRN, Krishnan, Gokul, MD, 650 mg at 12/24/23 9371   aspirin  EC tablet 81 mg, 81 mg, Oral, Daily, Krishnan, Gokul, MD   atorvastatin  (LIPITOR) tablet 80 mg, 80 mg, Oral, Daily, Krishnan, Gokul, MD   folic acid  (FOLVITE ) tablet 1 mg, 1 mg, Oral, Daily, Krishnan, Gokul, MD   heparin  ADULT infusion  100 units/mL (25000 units/250mL), 1,000 Units/hr, Intravenous, Continuous, Palumbo, April, MD, Last Rate: 10 mL/hr at 12/24/23 0031, 1,000 Units/hr at 12/24/23 0031   insulin  aspart (novoLOG ) injection 0-15 Units, 0-15 Units,  Subcutaneous, TID WC, Krishnan, Gokul, MD, 3 Units at 12/24/23 9371   LORazepam  (ATIVAN ) tablet 1-4 mg, 1-4 mg, Oral, Q1H PRN **OR** LORazepam  (ATIVAN ) injection 1-4 mg, 1-4 mg, Intravenous, Q1H PRN, Krishnan, Gokul, MD   multivitamin with minerals tablet 1 tablet, 1 tablet, Oral, Daily, Krishnan, Gokul, MD   nitroGLYCERIN  (NITROSTAT ) SL tablet 0.4 mg, 0.4 mg, Sublingual, Q5 Min x 3 PRN, Krishnan, Gokul, MD   ondansetron  (ZOFRAN ) injection 4 mg, 4 mg, Intravenous, Q6H PRN, Krishnan, Gokul, MD   thiamine  (VITAMIN B1) tablet 100 mg, 100 mg, Oral, Daily **OR** thiamine  (VITAMIN B1) injection 100 mg, 100 mg, Intravenous, Daily, Krishnan, Gokul, MD  Assessment & Plan .     1.  NSTEMI 2.  Primary hypertension 3.  Hypercholesterolemia 4.  Diabetes mellitus type 2 without complication. 5.  Peripheral arterial disease with symptoms of bilateral hip claudication and lower extremity weakness 6.  Tobacco use disorder 7.  Polysubstance use including excess and heavy alcohol and cocaine use 8.  Presently out of work   Recommendations: Extremely complex situation socially in view of ongoing substance use and clearly the EKG abnormalities suggest a mid LAD territory ischemia/subendocardial infarct.  Patient is noncompliant with his medications and does not have a PCP he follows, hence I am very concerned about proceeding with cardiac catheterization and possible angioplasty.  I would recommend medical therapy for now.  He needs risk factor evaluation and management including antihypertensive medications, lipid management and diabetes management.  For now continue IV heparin .  Continue atorvastatin  80 mg daily, aspirin  81 mg daily, I will add Inderal  for frequent PACs and for hypertension and low-dose of losartan  25 mg daily in view of reduced LVEF.  Suspect EKG abnormality is probably related to hypertension with hypertensive heart disease changes and also  subendocardial infarction probably improved with  anticoagulation and antiplatelet therapy.  There is no wall motion abnormality but global hypokinesis by echocardiogram with mild decrease in LVEF and borderline LV dilatation.  Will discontinue IV heparin  and switch more to Plavix  75 mg daily.  Changed to subcu heparin  for DVT prophylaxis.  His vascular examination is abnormal.  His femoral pulses are 1+ and bilateral lower EXTR pulses are absent and suspect aortoiliac disease related to smoking as well.  I have ordered lower extremity arterial duplex and ABI.  He still appears to be under the influence of substance, tossing and turning in this bed, drifts to sleep while discussing medical issues.  Echocardiogram performed, tech had significant issues trying to keep him still as well.  For questions or updates, please contact [facility] Please consult [email or web address] for contact info under     Signed,   Gordy Bergamo, MD, [facility] 12/24/2023, 8:54 AM [facility] [address] [place], [place] [address] Phone: [phone]. Fax:  [phone]

## 2023-12-24 NOTE — Progress Notes (Signed)
 This RN went into room to notify patient of his transport to vascular for ultrasound. Patient has book bag with wallet and undisclosed amount of money at bedside. RN offered to send valuables to security, patient declined. Patient placed wallet in pants in left cargo pocket.

## 2023-12-24 NOTE — Progress Notes (Signed)
 PHARMACY - ANTICOAGULATION CONSULT NOTE  Pharmacy Consult for Heparin   Indication: chest pain/ACS  No Known Allergies  Patient Measurements: Height: 5' 11 (180.3 cm) Weight: 72.9 kg (160 lb 11.5 oz) IBW/kg (Calculated) : 75.3 HEPARIN  DW (KG): 72.9  Vital Signs: Temp: 99.1 F (37.3 C) (09/05 0117) Temp Source: Oral (09/05 0117) BP: 167/74 (09/05 0117) Pulse Rate: 81 (09/05 0117)  Labs: Recent Labs    12/23/23 2131 12/23/23 2357  HGB 14.8  --   HCT 43.6  --   PLT 253  --   CREATININE 1.25*  --   TROPONINIHS 341* 320*    Estimated Creatinine Clearance: 64 mL/min (A) (by C-G formula based on SCr of 1.25 mg/dL (H)).   Medical History: Past Medical History:  Diagnosis Date   Diabetes mellitus without complication (HCC)    Essential hypertension    Hyperlipidemia    Osteoarthritis of L hip      Assessment: 63 y/o M with chest pain and elevated high sensitivity troponin. Starting heparin  for now. Above labs reviewed. PTA meds reviewed.   Goal of Therapy:  Heparin  level 0.3-0.7 units/ml Monitor platelets by anticoagulation protocol: Yes   Plan:  Heparin  4000 units BOLUS Start heparin  drip at 1000 units/hr Heparin  level in 8 hours Daily CBC/Heparin  level Monitor for bleeding  Lynwood Mckusick, PharmD, BCPS Clinical Pharmacist Phone: [phone]

## 2023-12-24 NOTE — TOC CM/SW Note (Signed)
 Transition of Care [facility]) - Inpatient Brief Assessment   Patient Details  Name: Carlos Charles MRN: [id number] Date of Birth: 11-22-1960  Transition of Care [facility]) CM/SW Contact:    Lauraine FORBES Saa, LCSWA Phone Number: 12/24/2023, 12:14 PM   Clinical Narrative:  12:14 PM Per chart review, patient resides at home alone. Patient does not have a PCP or insurance. CSW consulted financial counseling for insurance needs. Patient does not have SNF/HH/DME history. Patient's preferred pharmacy is [facility] [place]. Patient declined CSW offer of substance use resources. TOC will continue to follow and be available to assist.  Transition of Care Asessment: Insurance and Status: Selfpay Patient has primary care physician: No Home environment has been reviewed: Private Residence Prior level of function:: N/A Prior/Current Home Services: No current home services Social Drivers of Health Review: SDOH reviewed no interventions necessary Readmission risk has been reviewed: Yes (Currently Green 9%) Transition of care needs: transition of care needs identified, TOC will continue to follow

## 2023-12-24 NOTE — Consult Note (Signed)
 Cardiology Consultation   Patient ID: [name] MRN: [id number]; DOB: 15-Feb-1961  Admit date: 12/23/2023 Date of Consult: 12/24/2023  PCP:  Patient, No Pcp Per   [facility] Providers Cardiologist:  None     Patient Profile: Carlos Charles is a 63 y.o. male with a hx of DM, htn, hld, and osteoarthritis who is being seen 12/24/2023 for the evaluation of chest pain at the request of the ED.  History of Present Illness: [name] presented to the ED with both chest pain and left hip pain. He has had left hip for some time now and states he needs a hip replacement. He also states he has had intermittent chest pain episodes over the past day. He currently denies any chest pain but states he has some discomfort with taking a deep breath. However, he tells me that the main thing bothering him is his hip pain and feeling weakness.  Initial troponin was 341 with repeat of 320. Vitals have been stable. No VT on telemetry. Currently, no chest pain or shortness of breath.   Past Medical History:  Diagnosis Date   Diabetes mellitus without complication (HCC)    Essential hypertension    Hyperlipidemia    Osteoarthritis of L hip     Past Surgical History:  Procedure Laterality Date   I & D EXTREMITY Left 03/09/2019   Procedure: IRRIGATION AND DEBRIDEMENT Left Hand;  Surgeon: Carolee Lynwood JINNY DOUGLAS, MD;  Location: [facility] OR;  Service: Orthopedics;  Laterality: Left;     Home Medications:  Prior to Admission medications   Medication Sig Start Date End Date Taking? Authorizing Provider  blood glucose meter kit and supplies KIT Dispense based on patient and insurance preference. Use up to four times daily as directed. (FOR ICD-9 250.00, 250.01). 03/13/19   Golda Lynwood JINNY, MD  lisinopril  (ZESTRIL ) 10 MG tablet Take 1 tablet (10 mg total) by mouth daily. 07/26/22   Towana Ozell BROCKS, MD  meloxicam  (MOBIC ) 15 MG tablet Take 1 tablet (15 mg total) by mouth daily. 12/03/23   Johnie Flaming A,  NP  metFORMIN  (GLUCOPHAGE ) 1000 MG tablet Take 1 tablet (1,000 mg total) by mouth 2 (two) times daily with a meal. 03/20/19   Ruthann Basket, MD    Scheduled Meds:  heparin   4,000 Units Intravenous Once   Continuous Infusions:  heparin      PRN Meds:   Allergies:   No Known Allergies  Social History:   Social History   Socioeconomic History   Marital status: Single    Spouse name: Not on file   Number of children: Not on file   Years of education: Not on file   Highest education level: Not on file  Occupational History   Not on file  Tobacco Use   Smoking status: Every Day    Current packs/day: 1.00    Types: Cigarettes   Smokeless tobacco: Never   Tobacco comments:    1/3 pack per day   Vaping Use   Vaping status: Never Used  Substance and Sexual Activity   Alcohol use: Yes   Drug use: Not Currently    Types: Marijuana   Sexual activity: Not Currently    Birth control/protection: None  Other Topics Concern   Not on file  Social History Narrative   Not on file   Social Drivers of Health   Financial Resource Strain: Not on file  Food Insecurity: Not on file  Transportation Needs: Not on file  Physical Activity:  Not on file  Stress: Not on file  Social Connections: Not on file  Intimate Partner Violence: Not on file    Family History:    Family History  Problem Relation Age of Onset   Diabetes Mother    Healthy Father    Diabetes Sister      ROS:  Please see the history of present illness.  All other ROS reviewed and negative.     Physical Exam/Data: Vitals:   12/23/23 2126 12/23/23 2129  BP: (!) 176/86   Pulse: 79   Resp: 18   Temp: 98.2 F (36.8 C)   TempSrc: Oral   SpO2: 100%   Weight:  83.5 kg  Height:  5' 11 (1.803 m)   No intake or output data in the 24 hours ending 12/24/23 0021    12/23/2023    9:29 PM 12/24/2022    5:40 PM 04/13/2019    9:53 AM  Last 3 Weights  Weight (lbs) 184 lb 180 lb 173 lb  Weight (kg) 83.462 kg  81.647 kg 78.472 kg     Body mass index is 25.66 kg/m.  General:  Well nourished, well developed, in no acute distress HEENT: normal Neck: no JVD Vascular: No carotid bruits; Distal pulses 2+ bilaterally Cardiac:  normal S1, S2; RRR; no murmur Lungs:  clear to auscultation bilaterally, no wheezing, rhonchi or rales  Abd: soft, nontender, no hepatomegaly  Ext: no edema Musculoskeletal:  No deformities, BUE and BLE strength normal and equal Skin: warm and dry  Neuro:   no focal abnormalities noted Psych:  Normal affect   EKG:  The EKG was personally reviewed and demonstrates:  SR with STE V2, subtle STE I and avL, Deep TWI V4-V5, TWI V6 Telemetry:  Telemetry was personally reviewed and demonstrates:  sinus arrhythmia, no VT or NSVT  Relevant CV Studies: pending  Laboratory Data: High Sensitivity Troponin:   Recent Labs  Lab 12/23/23 2131  TROPONINIHS 341*     Chemistry Recent Labs  Lab 12/23/23 2131  NA 133*  K 3.9  CL 98  CO2 20*  GLUCOSE 247*  BUN 13  CREATININE 1.25*  CALCIUM  9.2  GFRNONAA >60  ANIONGAP 15    Hematology Recent Labs  Lab 12/23/23 2131  WBC 11.0*  RBC 4.62  HGB 14.8  HCT 43.6  MCV 94.4  MCH 32.0  MCHC 33.9  RDW 12.6  PLT 253   Radiology/Studies:  DG Knee Complete 4 Views Left Result Date: 12/23/2023 CLINICAL DATA:  knee pain EXAM: LEFT KNEE - COMPLETE 4+ VIEW COMPARISON:  X-ray left knee 12/03/2023 FINDINGS: No evidence of fracture, dislocation, or joint effusion. Similar-appearing osteo necrosis of the distal femoral metadiaphysis. No evidence of arthropathy or aggressive appearing focal bone abnormality. Soft tissues are unremarkable. Vascular calcifications. IMPRESSION: No acute displaced fracture or dislocation. Electronically Signed   By: Morgane  Naveau M.D.   On: 12/23/2023 22:17   DG Hip Unilat With Pelvis 2-3 Views Left Result Date: 12/23/2023 CLINICAL DATA:  left hip pain EXAM: DG HIP (WITH OR WITHOUT PELVIS) 2-3V LEFT  COMPARISON:  X-ray pelvis 11/10/2022 FINDINGS: Chronic left femoral head avascular necrosis. Severe degenerative changes left hip with loss of the femoroacetabular joint space and subchondral cystic changes as well as sclerosis. At least mild degenerative changes of the right hip with avascular necrosis of the femoral head. No acute displaced fracture or dislocation of either hips. No acute displaced fracture or diastasis of the bones of the pelvis. IMPRESSION: 1. Chronic  left femoral head avascular necrosis with severe degenerative changes of the left hip. 2. At least mild degenerative changes of the right hip with associated femoral head avascular necrosis. Electronically Signed   By: Morgane  Naveau M.D.   On: 12/23/2023 22:16   DG Chest 2 View Result Date: 12/23/2023 CLINICAL DATA:  chest pain EXAM: CHEST - 2 VIEW COMPARISON:  Chest x-ray 07/26/2022 FINDINGS: The heart and mediastinal contours are unchanged. No focal consolidation. No pulmonary edema. No pleural effusion. No pneumothorax. No acute osseous abnormality. Degenerative changes of the acromioclavicular joints. IMPRESSION: No active cardiopulmonary disease. Electronically Signed   By: Morgane  Naveau M.D.   On: 12/23/2023 22:14   Assessment and Plan: Suspected NSTEMI Type 1 Chest pain  Htn  Hld Patient developed chest pain earlier today and has an initial troponin elevated of 341. Troponin has peaked and downtrended to 320. No current chest pain. He did have some subtle ST elevations on his ECG. I reviewed this with the oncall interventionalist. However, we felt this did not meet STEMI criteria as the patient was not having active chest pain and did not have STE >1 mm in 2 contiguous leads. His ECGs taken >2 hrs apart did not show evolving infarct. The patient does not need to go to the cath lab at this time, however, if he has further pain, would need additional ECG and troponin. He has been aspirin  loaded in the ED. Differential includes  myocarditis, however, given his risk factors, my highest suspicion is for ACS.  - Heparin  gtt for ACS protocol  - Aspirin  81mg  daily - Start atorvastatin  80mg  daily - Lipid panel and hgb A1c with AM labs - TTE in AM - NPO for coronary angiogram in AM - PRN nitroglycerin  vs nitroglycerin  gtt for chest pain - ECG and troponin for further chest pain episodes   Hip pain  Weakness Per primary. Weakness seems to be related to pain episodes  AKI vs CKD Cr 1.25 which is increased from last Cr ~1.5 years ago. Unclear if AKI or now has CKD.  Risk Assessment/Risk Scores:  TIMI Risk Score for Unstable Angina or Non-ST Elevation MI:   The patient's TIMI risk score is 2, which indicates a 2.2% risk of all cause mortality, new or recurrent myocardial infarction or need for urgent revascularization in the next 14 days.  GRACE score 103 with 4% Probability of death from admission to 6 months   For questions or updates, please contact [facility] Please consult [email or web address] for contact info under    Signed, Jerrell DELENA Orchard, MD  12/24/2023 12:21 AM

## 2023-12-24 NOTE — Plan of Care (Signed)

## 2023-12-24 NOTE — Plan of Care (Signed)
  Problem: Education: Goal: Knowledge of General Education information will improve Description: Including pain rating scale, medication(s)/side effects and non-pharmacologic comfort measures Outcome: Progressing   Problem: Health Behavior/Discharge Planning: Goal: Ability to manage health-related needs will improve Outcome: Progressing   Problem: Clinical Measurements: Goal: Ability to maintain clinical measurements within normal limits will improve Outcome: Progressing Goal: Will remain free from infection Outcome: Progressing Goal: Diagnostic test results will improve Outcome: Progressing Goal: Respiratory complications will improve Outcome: Progressing Goal: Cardiovascular complication will be avoided Outcome: Progressing   Problem: Activity: Goal: Risk for activity intolerance will decrease Outcome: Progressing   Problem: Nutrition: Goal: Adequate nutrition will be maintained Outcome: Progressing   Problem: Coping: Goal: Level of anxiety will decrease Outcome: Progressing   Problem: Elimination: Goal: Will not experience complications related to bowel motility Outcome: Progressing Goal: Will not experience complications related to urinary retention Outcome: Progressing   Problem: Pain Managment: Goal: General experience of comfort will improve and/or be controlled Outcome: Progressing   Problem: Safety: Goal: Ability to remain free from injury will improve Outcome: Progressing   Problem: Skin Integrity: Goal: Risk for impaired skin integrity will decrease Outcome: Progressing   Problem: Education: Goal: Ability to describe self-care measures that may prevent or decrease complications (Diabetes Survival Skills Education) will improve Outcome: Progressing Goal: Individualized Educational Video(s) Outcome: Progressing   Problem: Coping: Goal: Ability to adjust to condition or change in health will improve Outcome: Progressing   Problem: Fluid  Volume: Goal: Ability to maintain a balanced intake and output will improve Outcome: Progressing   Problem: Health Behavior/Discharge Planning: Goal: Ability to identify and utilize available resources and services will improve Outcome: Progressing Goal: Ability to manage health-related needs will improve Outcome: Progressing   Problem: Metabolic: Goal: Ability to maintain appropriate glucose levels will improve Outcome: Progressing   Problem: Nutritional: Goal: Maintenance of adequate nutrition will improve Outcome: Progressing Goal: Progress toward achieving an optimal weight will improve Outcome: Progressing   Problem: Skin Integrity: Goal: Risk for impaired skin integrity will decrease Outcome: Progressing   Problem: Tissue Perfusion: Goal: Adequacy of tissue perfusion will improve Outcome: Progressing   Problem: Education: Goal: Understanding of cardiac disease, CV risk reduction, and recovery process will improve Outcome: Progressing Goal: Individualized Educational Video(s) Outcome: Progressing   Problem: Activity: Goal: Ability to tolerate increased activity will improve Outcome: Progressing

## 2023-12-24 NOTE — Progress Notes (Signed)
 PROGRESS NOTE    Carlos Charles  FMW:[id number] DOB: 21-Sep-1960 DOA: 12/23/2023 PCP: Patient, No Pcp Per   Brief Narrative:  63 year old male with past medical history of hypertension, diabetes, polysubstance use comes in for evaluation of chest pain.  Abnormal  EKG with mild ST elevation and elevated troponins were noted.  Patient admitted for further management/evaluation.  Assessment & Plan:   Principal Problem:   NSTEMI (non-ST elevated myocardial infarction) (HCC) Active Problems:   Type 2 diabetes mellitus (HCC)   Hypertension   Alcohol abuse   Cocaine use   Peripheral artery disease (HCC)   Assessment: This is a 63 year old African-American male with past medical history of essential hypertension and diabetes who comes in with chest pain shortness of breath.  Found to have abnormal EKG with elevated troponin levels.  He admits to cocaine use within the last 1 week for pain issues.  He also smokes cigarettes on a daily basis and consumes alcohol quite heavily on a daily basis.   Plan: #1 NSTEMI: Multiple risk factors are present.  Troponin levels elevated but flat trend is noted.  EKG with concerning changes but according to cardiology no significant ST elevation is noted.    - Patient evaluated by cardiology.  Cardiology is concerned about proceeding with cath and possible angioplasty given his ongoing substance use, noncompliance situation.  Medical management advised. -Heparin  discontinued and started on aspirin  and Plavix . -Echocardiogram showed LVEF of 45% with global hypokinesis.  Started on losartan , Inderal , Jardiance .  # Diminished pulses both lower extremities: ABI/arterial duplex pending   2.  Diabetes mellitus type 2: Hemoglobin A1c 6.7.  Started on sliding scale insulin .  #3 Essential hypertension/hyponatremia/mildly elevated creatinine: Monitor daily BMP.  4.  Cocaine use: Urine drug screen.  Counseling provided.   5.  Alcohol use: Drinks at least 5 beers on a  daily basis with liquor use over the weekend.  CIWA protocol.  TOC to see patient   6.  Nicotine  abuse: Counseled.  Nicotine  patch will be ordered if needed.   7.  Arthritis involving both hips with avascular necrosis noted on imaging studies.  Outpatient management.     DVT Prophylaxis: IV heparin  Code Status: Full code Family Communication: Discussed with patient Disposition: Home when improved Consults called: Cardiology Admission Status: Inpatient for NSTEMI    Consultants:   Procedures:   Antimicrobials:    Subjective: Patient seen and examined at the bedside.  He reports right shoulder pain which feels slightly better with heat.,  Denies any chest pain or shortness of breath today.  Vital signs are stable.  Blood pressure is on the higher side.  Objective: Vitals:   12/24/23 0600 12/24/23 0823 12/24/23 1047 12/24/23 1108  BP:  123/72 114/64 (!) 141/64  Pulse: 72 77 67 67  Resp:  18 19   Temp:  99 F (37.2 C) 99 F (37.2 C)   TempSrc:  Oral Oral   SpO2:  96% 94%   Weight:      Height:        Intake/Output Summary (Last 24 hours) at 12/24/2023 1618 Last data filed at 12/24/2023 1500 Gross per 24 hour  Intake 360.68 ml  Output 200 ml  Net 160.68 ml   Filed Weights   12/23/23 2129 12/24/23 0117  Weight: 83.5 kg 72.9 kg    Examination:  General exam: Appears calm and comfortable  Respiratory system: Bilateral decreased breath sounds at bases Cardiovascular system: S1 & S2 heard, Rate controlled Gastrointestinal system: Abdomen  is nondistended, soft and nontender. Normal bowel sounds heard. Extremities: No cyanosis, clubbing, edema  Central nervous system: Alert and oriented. No focal neurological deficits. Moving extremities Skin: No rashes, lesions or ulcers Psychiatry: Judgement and insight appear normal. Mood & affect appropriate.     Data Reviewed: I have personally reviewed following labs and imaging studies  CBC: Recent Labs  Lab 12/23/23 2131  12/24/23 0258  WBC 11.0* 10.0  HGB 14.8 14.2  HCT 43.6 41.8  MCV 94.4 93.1  PLT 253 248   Basic Metabolic Panel: Recent Labs  Lab 12/23/23 2131 12/24/23 0258  NA 133* 134*  K 3.9 3.8  CL 98 101  CO2 20* 23  GLUCOSE 247* 134*  BUN 13 13  CREATININE 1.25* 1.06  CALCIUM  9.2 8.9   GFR: Estimated Creatinine Clearance: 75.5 mL/min (by C-G formula based on SCr of 1.06 mg/dL). Liver Function Tests: Recent Labs  Lab 12/23/23 2357 12/24/23 0258  AST 18 16  ALT 14 13  ALKPHOS 72 71  BILITOT 0.4 0.6  PROT 7.2 7.2  ALBUMIN 3.3* 3.3*   No results for input(s): LIPASE, AMYLASE in the last 168 hours. No results for input(s): AMMONIA in the last 168 hours. Coagulation Profile: No results for input(s): INR, PROTIME in the last 168 hours. Cardiac Enzymes: No results for input(s): CKTOTAL, CKMB, CKMBINDEX, TROPONINI in the last 168 hours. BNP (last 3 results) No results for input(s): PROBNP in the last 8760 hours. HbA1C: Recent Labs    12/24/23 0258  HGBA1C 6.7*   CBG: Recent Labs  Lab 12/24/23 0613 12/24/23 1046 12/24/23 1218 12/24/23 1326 12/24/23 1430  GLUCAP 158* 144* 354* 437* 285*   Lipid Profile: Recent Labs    12/24/23 0258  CHOL 183  HDL 57  LDLCALC 112*  TRIG 69  CHOLHDL 3.2   Thyroid Function Tests: Recent Labs    12/24/23 0258  TSH 1.958   Anemia Panel: No results for input(s): VITAMINB12, FOLATE, FERRITIN, TIBC, IRON, RETICCTPCT in the last 72 hours. Sepsis Labs: No results for input(s): PROCALCITON, LATICACIDVEN in the last 168 hours.  Recent Results (from the past 240 hours)  Surgical pcr screen     Status: None   Collection Time: 12/24/23  1:26 AM   Specimen: Nasal Mucosa; Nasal Swab  Result Value Ref Range Status   MRSA, PCR NEGATIVE NEGATIVE Final   Staphylococcus aureus NEGATIVE NEGATIVE Final    Comment: (NOTE) The Xpert SA Assay (FDA approved for NASAL specimens in patients 66 years of age  and older), is one component of a comprehensive surveillance program. It is not intended to diagnose infection nor to guide or monitor treatment. Performed at [facility], [address], [place], [place] [address]          Radiology Studies: ECHOCARDIOGRAM COMPLETE Result Date: 12/24/2023    ECHOCARDIOGRAM REPORT   Patient Name:   Carlos Charles Date of Exam: 12/24/2023 Medical Rec #:  [id number]     Height:       71.0 in Accession #:    [id number]    Weight:       160.7 lb Date of Birth:  [birth year]      BSA:          1.921 m Patient Age:    63 years      BP:           123/72 mmHg Patient Gender: M  HR:           78 bpm. Exam Location:  Inpatient Procedure: 2D Echo, Cardiac Doppler, Color Doppler and Intracardiac            Opacification Agent (Both Spectral and Color Flow Doppler were            utilized during procedure). Indications:    R07.9* Chest pain, unspecified  History:        Patient has no prior history of Echocardiogram examinations.                 Risk Factors:Hypertension, Diabetes, Dyslipidemia and                 Polysubstance Abuse.  Sonographer:    Damien Senior RDCS Referring Phys: [phone] [facility]  Sonographer Comments: Technically difficult due to poor apical window and constant patient movement. IMPRESSIONS  1. Left ventricular ejection fraction, by estimation, is 55 to 60%. The left ventricle has normal function. The left ventricle has no regional wall motion abnormalities. Left ventricular diastolic parameters were normal.  2. Right ventricular systolic function is normal. The right ventricular size is normal. There is normal pulmonary artery systolic pressure.  3. The mitral valve is normal in structure. Trivial mitral valve regurgitation. No evidence of mitral stenosis.  4. The aortic valve is tricuspid. Aortic valve regurgitation is not visualized. No aortic stenosis is present.  5. The inferior vena cava is normal in size with greater than 50% respiratory  variability, suggesting right atrial pressure of 3 mmHg. Comparison(s): No prior Echocardiogram. FINDINGS  Left Ventricle: Left ventricular ejection fraction, by estimation, is 55 to 60%. The left ventricle has normal function. The left ventricle has no regional wall motion abnormalities. The left ventricular internal cavity size was normal in size. There is  no left ventricular hypertrophy. Left ventricular diastolic parameters were normal. Right Ventricle: The right ventricular size is normal. No increase in right ventricular wall thickness. Right ventricular systolic function is normal. There is normal pulmonary artery systolic pressure. The tricuspid regurgitant velocity is 1.76 m/s, and  with an assumed right atrial pressure of 3 mmHg, the estimated right ventricular systolic pressure is 15.4 mmHg. Left Atrium: Left atrial size was normal in size. Right Atrium: Right atrial size was normal in size. Pericardium: There is no evidence of pericardial effusion. Mitral Valve: The mitral valve is normal in structure. Trivial mitral valve regurgitation. No evidence of mitral valve stenosis. Tricuspid Valve: The tricuspid valve is normal in structure. Tricuspid valve regurgitation is mild . No evidence of tricuspid stenosis. Aortic Valve: The aortic valve is tricuspid. Aortic valve regurgitation is not visualized. No aortic stenosis is present. Aortic valve mean gradient measures 5.0 mmHg. Aortic valve peak gradient measures 9.6 mmHg. Aortic valve area, by VTI measures 2.19 cm. Pulmonic Valve: The pulmonic valve was normal in structure. Pulmonic valve regurgitation is not visualized. No evidence of pulmonic stenosis. Aorta: The aortic root and ascending aorta are structurally normal, with no evidence of dilitation. Venous: The inferior vena cava is normal in size with greater than 50% respiratory variability, suggesting right atrial pressure of 3 mmHg. IAS/Shunts: The atrial septum is grossly normal.  LEFT VENTRICLE  PLAX 2D LVIDd:         5.20 cm LVIDs:         4.60 cm LV PW:         1.30 cm LV IVS:        0.80 cm LVOT diam:  2.20 cm LV SV:         53 LV SV Index:   28 LVOT Area:     3.80 cm  RIGHT VENTRICLE RV S prime:     13.50 cm/s TAPSE (M-mode): 1.8 cm LEFT ATRIUM             Index        RIGHT ATRIUM           Index LA diam:        3.50 cm 1.82 cm/m   RA Area:     16.20 cm LA Vol (A2C):   47.7 ml 24.83 ml/m  RA Volume:   46.40 ml  24.15 ml/m LA Vol (A4C):   71.8 ml 37.37 ml/m LA Biplane Vol: 59.2 ml 30.81 ml/m  AORTIC VALVE AV Area (Vmax):    2.10 cm AV Area (Vmean):   2.33 cm AV Area (VTI):     2.19 cm AV Vmax:           155.00 cm/s AV Vmean:          102.000 cm/s AV VTI:            0.243 m AV Peak Grad:      9.6 mmHg AV Mean Grad:      5.0 mmHg LVOT Vmax:         85.50 cm/s LVOT Vmean:        62.600 cm/s LVOT VTI:          0.140 m LVOT/AV VTI ratio: 0.58  AORTA Ao Root diam: 3.00 cm Ao Asc diam:  3.20 cm TRICUSPID VALVE TR Peak grad:   12.4 mmHg TR Vmax:        176.00 cm/s  SHUNTS Systemic VTI:  0.14 m Systemic Diam: 2.20 cm Sunit Tolia Electronically signed by Madonna Large Signature Date/Time: 12/24/2023/11:21:36 AM    Final    DG Knee Complete 4 Views Left Result Date: 12/23/2023 CLINICAL DATA:  knee pain EXAM: LEFT KNEE - COMPLETE 4+ VIEW COMPARISON:  X-ray left knee 12/03/2023 FINDINGS: No evidence of fracture, dislocation, or joint effusion. Similar-appearing osteo necrosis of the distal femoral metadiaphysis. No evidence of arthropathy or aggressive appearing focal bone abnormality. Soft tissues are unremarkable. Vascular calcifications. IMPRESSION: No acute displaced fracture or dislocation. Electronically Signed   By: Morgane  Naveau M.D.   On: 12/23/2023 22:17   DG Hip Unilat With Pelvis 2-3 Views Left Result Date: 12/23/2023 CLINICAL DATA:  left hip pain EXAM: DG HIP (WITH OR WITHOUT PELVIS) 2-3V LEFT COMPARISON:  X-ray pelvis 11/10/2022 FINDINGS: Chronic left femoral head avascular necrosis.  Severe degenerative changes left hip with loss of the femoroacetabular joint space and subchondral cystic changes as well as sclerosis. At least mild degenerative changes of the right hip with avascular necrosis of the femoral head. No acute displaced fracture or dislocation of either hips. No acute displaced fracture or diastasis of the bones of the pelvis. IMPRESSION: 1. Chronic left femoral head avascular necrosis with severe degenerative changes of the left hip. 2. At least mild degenerative changes of the right hip with associated femoral head avascular necrosis. Electronically Signed   By: Morgane  Naveau M.D.   On: 12/23/2023 22:16   DG Chest 2 View Result Date: 12/23/2023 CLINICAL DATA:  chest pain EXAM: CHEST - 2 VIEW COMPARISON:  Chest x-ray 07/26/2022 FINDINGS: The heart and mediastinal contours are unchanged. No focal consolidation. No pulmonary edema. No pleural effusion. No pneumothorax. No acute osseous abnormality. Degenerative  changes of the acromioclavicular joints. IMPRESSION: No active cardiopulmonary disease. Electronically Signed   By: Morgane  Naveau M.D.   On: 12/23/2023 22:14        Scheduled Meds:  aspirin  EC  81 mg Oral Daily   atorvastatin   80 mg Oral Daily   clopidogrel   75 mg Oral Daily   empagliflozin   10 mg Oral Daily   folic acid   1 mg Oral Daily   heparin  injection (subcutaneous)  5,000 Units Subcutaneous Q8H   insulin  aspart  0-15 Units Subcutaneous TID WC   losartan   25 mg Oral Daily   multivitamin with minerals  1 tablet Oral Daily   propranolol   20 mg Oral TID   thiamine   100 mg Oral Daily   Or   thiamine   100 mg Intravenous Daily   Continuous Infusions:  sodium chloride  75 mL/hr at 12/24/23 0314          Ziare Cryder, MD [facility] 12/24/2023, 4:18 PM

## 2023-12-24 NOTE — Telephone Encounter (Signed)
 Patient Product/process development scientist completed.    The patient is insured through [employer] [facility]. Patient has [employer], may use a copay card, and/or apply for patient assistance if available.    Ran test claim for Farxiga 10 mg and the current 30 day co-pay is $556.52 due to a $4700 deductible.   This test claim was processed through [facility]- copay amounts may vary at other pharmacies due to pharmacy/plan contracts, or as the patient moves through the different stages of their insurance plan.     Reyes Sharps, CPHT Pharmacy Technician III Certified Patient Advocate [facility] Patient Advocate Team Direct Number: [phone]  Fax: [phone]

## 2023-12-24 NOTE — Progress Notes (Signed)
 ABI exam is completed. Jerime Arif, RVT

## 2023-12-24 NOTE — H&P (Addendum)
 [facility] History and Physical  Xzayvier Fagin FMW:[id number] DOB: 11/14/1960 DOA: 12/23/2023   PCP: Patient, No Pcp Per  Specialists: None  Chief Complaint: Chest pain and shortness of breath  HPI: Dalan Cowger is a 63 y.o. male with a past medical history of hypertension and diabetes who was in his usual state of health until yesterday morning when he started developing pain in the chest area.  Some radiation to the left arm and shoulder.  Chest pain was 5 out of 10 in intensity which felt like a pressure-like sensation. Developed shortness of breath.  He was resting at the time of the onset of the pain.  He has chronic hip pain and while he was walking his leg gave out and he fell onto the floor but denies any injuries.  The main reason for him to come to the hospital was the chest discomfort.  He underwent multiple imaging studies which did not show any fractures.  EKG however was noted to be abnormal.  Elevated troponin levels were noted.  Cardiology was consulted.  Currently patient denies any chest pain.   No shortness of breath at rest.  Some nausea but no vomiting.  He has never been diagnosed with heart condition previously.  Denies any orthopedic procedures in the past.  Home Medications: This list is not reconciled yet Prior to Admission medications   Medication Sig Start Date End Date Taking? Authorizing Provider  blood glucose meter kit and supplies KIT Dispense based on patient and insurance preference. Use up to four times daily as directed. (FOR ICD-9 250.00, 250.01). 03/13/19   Golda Lynwood PARAS, MD  lisinopril  (ZESTRIL ) 10 MG tablet Take 1 tablet (10 mg total) by mouth daily. 07/26/22   Towana Ozell BROCKS, MD  meloxicam  (MOBIC ) 15 MG tablet Take 1 tablet (15 mg total) by mouth daily. 12/03/23   Johnie Rumaldo LABOR, NP  metFORMIN  (GLUCOPHAGE ) 1000 MG tablet Take 1 tablet (1,000 mg total) by mouth 2 (two) times daily with a meal. 03/20/19   Ruthann Basket, MD     Allergies: No Known Allergies  Past Medical History: Past Medical History:  Diagnosis Date   Diabetes mellitus without complication (HCC)    Essential hypertension    Hyperlipidemia    Osteoarthritis of L hip     Past Surgical History:  Procedure Laterality Date   I & D EXTREMITY Left 03/09/2019   Procedure: IRRIGATION AND DEBRIDEMENT Left Hand;  Surgeon: Carolee Lynwood PARAS DOUGLAS, MD;  Location: [facility] OR;  Service: Orthopedics;  Laterality: Left;    Social History: Smokes a pack of cigarettes on a daily basis.  He uses cocaine occasionally for pain issues.  Last use was about a week ago.  Drinks about 4-5 beers on a daily basis and drinks liquor over the weekend.  Family History:  Family History  Problem Relation Age of Onset   Diabetes Mother    Healthy Father    Diabetes Sister      Review of Systems - History obtained from the patient General ROS: positive for  - fatigue Psychological ROS: negative Ophthalmic ROS: negative ENT ROS: negative Allergy and Immunology ROS: negative Hematological and Lymphatic ROS: negative Endocrine ROS: negative Respiratory ROS: As in HPI Cardiovascular ROS: As in HPI Gastrointestinal ROS: no abdominal pain, change in bowel habits, or black or bloody stools Genito-Urinary ROS: no dysuria, trouble voiding, or hematuria Musculoskeletal ROS: negative Neurological ROS: no TIA or stroke symptoms Dermatological ROS: negative  Physical Examination  Vitals:  12/23/23 2126 12/23/23 2129 12/24/23 0000 12/24/23 0117  BP: (!) 176/86  (!) 148/86 (!) 167/74  Pulse: 79  73 81  Resp: 18  (!) 21 20  Temp: 98.2 F (36.8 C)   99.1 F (37.3 C)  TempSrc: Oral   Oral  SpO2: 100%  100% 98%  Weight:  83.5 kg  72.9 kg  Height:  5' 11 (1.803 m)  5' 11 (1.803 m)    BP (!) 167/74 (BP Location: Left Arm)   Pulse 81   Temp 99.1 F (37.3 C) (Oral)   Resp 20   Ht 5' 11 (1.803 m)   Wt 72.9 kg   SpO2 98%   BMI 22.42 kg/m   General appearance:  alert, cooperative, appears stated age, and no distress Head: Normocephalic, without obvious abnormality, atraumatic Eyes: conjunctivae/corneas clear. PERRL, EOM's intact.  Throat: lips, mucosa, and tongue normal; teeth and gums normal Neck: no adenopathy, no carotid bruit, no JVD, supple, symmetrical, trachea midline, and thyroid not enlarged, symmetric, no tenderness/mass/nodules Resp: clear to auscultation bilaterally Cardio: regular rate and rhythm, S1, S2 normal, no murmur, click, rub or gallop GI: soft, non-tender; bowel sounds normal; no masses,  no organomegaly Extremities: extremities normal, atraumatic, no cyanosis or edema Pulses: 2+ and symmetric Skin: Skin color, texture, turgor normal. No rashes or lesions Lymph nodes: Cervical, supraclavicular, and axillary nodes normal. Neurologic: Alert and oriented x 3.  No obvious focal neurological deficits noted.   Labs on Admission: I have personally reviewed following labs and imaging studies  CBC: Recent Labs  Lab 12/23/23 2131  WBC 11.0*  HGB 14.8  HCT 43.6  MCV 94.4  PLT 253   Basic Metabolic Panel: Recent Labs  Lab 12/23/23 2131  NA 133*  K 3.9  CL 98  CO2 20*  GLUCOSE 247*  BUN 13  CREATININE 1.25*  CALCIUM  9.2   GFR: Estimated Creatinine Clearance: 64 mL/min (A) (by C-G formula based on SCr of 1.25 mg/dL (H)).    Radiological Exams on Admission: DG Knee Complete 4 Views Left Result Date: 12/23/2023 CLINICAL DATA:  knee pain EXAM: LEFT KNEE - COMPLETE 4+ VIEW COMPARISON:  X-ray left knee 12/03/2023 FINDINGS: No evidence of fracture, dislocation, or joint effusion. Similar-appearing osteo necrosis of the distal femoral metadiaphysis. No evidence of arthropathy or aggressive appearing focal bone abnormality. Soft tissues are unremarkable. Vascular calcifications. IMPRESSION: No acute displaced fracture or dislocation. Electronically Signed   By: Morgane  Naveau M.D.   On: 12/23/2023 22:17   DG Hip Unilat With  Pelvis 2-3 Views Left Result Date: 12/23/2023 CLINICAL DATA:  left hip pain EXAM: DG HIP (WITH OR WITHOUT PELVIS) 2-3V LEFT COMPARISON:  X-ray pelvis 11/10/2022 FINDINGS: Chronic left femoral head avascular necrosis. Severe degenerative changes left hip with loss of the femoroacetabular joint space and subchondral cystic changes as well as sclerosis. At least mild degenerative changes of the right hip with avascular necrosis of the femoral head. No acute displaced fracture or dislocation of either hips. No acute displaced fracture or diastasis of the bones of the pelvis. IMPRESSION: 1. Chronic left femoral head avascular necrosis with severe degenerative changes of the left hip. 2. At least mild degenerative changes of the right hip with associated femoral head avascular necrosis. Electronically Signed   By: Morgane  Naveau M.D.   On: 12/23/2023 22:16   DG Chest 2 View Result Date: 12/23/2023 CLINICAL DATA:  chest pain EXAM: CHEST - 2 VIEW COMPARISON:  Chest x-ray 07/26/2022 FINDINGS: The heart and  mediastinal contours are unchanged. No focal consolidation. No pulmonary edema. No pleural effusion. No pneumothorax. No acute osseous abnormality. Degenerative changes of the acromioclavicular joints. IMPRESSION: No active cardiopulmonary disease. Electronically Signed   By: Morgane  Naveau M.D.   On: 12/23/2023 22:14    My interpretation of Electrocardiogram: Sinus rhythm in the 80s.  Normal axis.  Intervals are normal.  Nonspecific ST segment changes.  Nonspecific T wave changes noted. Second EKG also raises concern for ST segment changes specially in V2 V3.  Prominent T wave inversions noted in V4 V5 V6.   Problem List  Principal Problem:   NSTEMI (non-ST elevated myocardial infarction) (HCC) Active Problems:   Type 2 diabetes mellitus (HCC)   Hypertension   Alcohol abuse   Cocaine use   Assessment: This is a 63 year old African-American male with past medical history of essential hypertension and  diabetes who comes in with chest pain shortness of breath.  Found to have abnormal EKG with elevated troponin levels.  He admits to cocaine use within the last 1 week for pain issues.  He also smokes cigarettes on a daily basis and consumes alcohol quite heavily on a daily basis.  Plan: #1 NSTEMI: Multiple risk factors are present.  Troponin levels elevated but flat trend is noted.  EKG with concerning changes but according to cardiology no significant ST elevation is noted.  Patient has been started on IV heparin .  Aspirin .  Statin will be ordered.  Lipid panel will be checked.  Urine drug screen.  N.p.o. past midnight for possible cardiac catheterization.  Echocardiogram will be ordered.  As needed nitroglycerin .  Defer beta-blocker to cardiology.  2.  Diabetes mellitus type 2: Check HbA1c.  SSI will be ordered.  #3 Essential hypertension/hyponatremia/mildly elevated creatinine: Monitor blood pressures closely.  Monitor sodium levels.  Check creatinine level in the morning.  Check LFTs.  4.  Cocaine use: Urine drug screen.  Counseling provided.  5.  Alcohol use: Drinks at least 5 beers on a daily basis with liquor use over the weekend.  CIWA protocol.  TOC to see patient  6.  Nicotine  abuse: Counseled.  Nicotine  patch will be ordered if needed.  7.  Arthritis involving both hips with avascular necrosis noted on imaging studies.  Outpatient management.   DVT Prophylaxis: IV heparin  Code Status: Full code Family Communication: Discussed with patient Disposition: Home when improved Consults called: Cardiology Admission Status: Inpatient for NSTEMI   Severity of Illness: The appropriate patient status for this patient is INPATIENT. Inpatient status is judged to be reasonable and necessary in order to provide the required intensity of service to ensure the patient's safety. The patient's presenting symptoms, physical exam findings, and initial radiographic and laboratory data in the context  of their chronic comorbidities is felt to place them at high risk for further clinical deterioration. Furthermore, it is not anticipated that the patient will be medically stable for discharge from the hospital within 2 midnights of admission.   * I certify that at the point of admission it is my clinical judgment that the patient will require inpatient hospital care spanning beyond 2 midnights from the point of admission due to high intensity of service, high risk for further deterioration and high frequency of surveillance required.*   Further management decisions will depend on results of further testing and patient's response to treatment.   Evolet Salminen  [facility] Pager on [email or web address]  12/24/2023, 1:55 AM

## 2023-12-24 NOTE — ED Provider Notes (Addendum)
 [facility] AT [facility] Provider Note   CSN: [id number] Arrival date & time: 12/23/23  2120     Patient presents with: Chest Pain, Hip Pain, and Weakness   Carlos Charles is a 63 y.o. male.   The history is provided by the patient.  Chest Pain Pain location:  Substernal area Pain quality: dull   Pain radiates to:  L arm Pain severity:  Moderate Onset quality:  Sudden Duration:  1 day Timing:  Intermittent Progression:  Waxing and waning Chronicity:  New Context: at rest   Relieved by:  Nothing Worsened by:  Nothing Ineffective treatments:  None tried Associated symptoms: shortness of breath   Hip Pain This is a chronic problem. The current episode started more than 1 week ago. The problem occurs constantly. Associated symptoms include shortness of breath. Nothing aggravates the symptoms. Nothing relieves the symptoms. He has tried nothing for the symptoms. The treatment provided no relief.       Prior to Admission medications   Medication Sig Start Date End Date Taking? Authorizing Provider  blood glucose meter kit and supplies KIT Dispense based on patient and insurance preference. Use up to four times daily as directed. (FOR ICD-9 250.00, 250.01). 03/13/19   Golda Lynwood PARAS, MD  lisinopril  (ZESTRIL ) 10 MG tablet Take 1 tablet (10 mg total) by mouth daily. 07/26/22   Towana Ozell BROCKS, MD  meloxicam  (MOBIC ) 15 MG tablet Take 1 tablet (15 mg total) by mouth daily. 12/03/23   Johnie Flaming A, NP  metFORMIN  (GLUCOPHAGE ) 1000 MG tablet Take 1 tablet (1,000 mg total) by mouth 2 (two) times daily with a meal. 03/20/19   Ruthann Basket, MD    Allergies: Patient has no known allergies.    Review of Systems  Constitutional:  Positive for diaphoresis.  Respiratory:  Positive for shortness of breath.   Cardiovascular:  Positive for chest pain.  Gastrointestinal:  Negative for nausea and vomiting.  Musculoskeletal:  Positive for  arthralgias.  Skin:  Negative for wound.  All other systems reviewed and are negative.   Updated Vital Signs BP (!) 148/86   Pulse 73   Temp 98.2 F (36.8 C) (Oral)   Resp (!) 21   Ht 5' 11 (1.803 m)   Wt 83.5 kg   SpO2 100%   BMI 25.66 kg/m   Physical Exam Vitals and nursing note reviewed.  Constitutional:      General: He is not in acute distress.    Appearance: Normal appearance. He is well-developed. He is not diaphoretic.  HENT:     Head: Normocephalic and atraumatic.     Nose: Nose normal.  Eyes:     Conjunctiva/sclera: Conjunctivae normal.     Pupils: Pupils are equal, round, and reactive to light.  Cardiovascular:     Rate and Rhythm: Normal rate and regular rhythm.     Pulses: Normal pulses.     Heart sounds: Normal heart sounds.  Pulmonary:     Effort: Pulmonary effort is normal.     Breath sounds: Normal breath sounds. No wheezing or rales.  Abdominal:     General: Bowel sounds are normal.     Palpations: Abdomen is soft.     Tenderness: There is no abdominal tenderness. There is no guarding or rebound.  Musculoskeletal:        General: Normal range of motion.     Cervical back: Normal range of motion and neck supple.  Skin:    General:  Skin is warm and dry.     Capillary Refill: Capillary refill takes less than 2 seconds.  Neurological:     General: No focal deficit present.     Mental Status: He is alert and oriented to person, place, and time.     Deep Tendon Reflexes: Reflexes normal.  Psychiatric:        Mood and Affect: Mood normal.     (all labs ordered are listed, but only abnormal results are displayed) Results for orders placed or performed during the hospital encounter of 12/23/23  Basic metabolic panel   Collection Time: 12/23/23  9:31 PM  Result Value Ref Range   Sodium 133 (L) 135 - 145 mmol/L   Potassium 3.9 3.5 - 5.1 mmol/L   Chloride 98 98 - 111 mmol/L   CO2 20 (L) 22 - 32 mmol/L   Glucose, Bld 247 (H) 70 - 99 mg/dL   BUN 13  8 - 23 mg/dL   Creatinine, Ser 8.74 (H) 0.61 - 1.24 mg/dL   Calcium  9.2 8.9 - 10.3 mg/dL   GFR, Estimated >39 >39 mL/min   Anion gap 15 5 - 15  CBC   Collection Time: 12/23/23  9:31 PM  Result Value Ref Range   WBC 11.0 (H) 4.0 - 10.5 K/uL   RBC 4.62 4.22 - 5.81 MIL/uL   Hemoglobin 14.8 13.0 - 17.0 g/dL   HCT 56.3 60.9 - 47.9 %   MCV 94.4 80.0 - 100.0 fL   MCH 32.0 26.0 - 34.0 pg   MCHC 33.9 30.0 - 36.0 g/dL   RDW 87.3 88.4 - 84.4 %   Platelets 253 150 - 400 K/uL   nRBC 0.0 0.0 - 0.2 %  Troponin I (High Sensitivity)   Collection Time: 12/23/23  9:31 PM  Result Value Ref Range   Troponin I (High Sensitivity) 341 (HH) <18 ng/L  Troponin I (High Sensitivity)   Collection Time: 12/23/23 11:57 PM  Result Value Ref Range   Troponin I (High Sensitivity) 320 (HH) <18 ng/L   DG Knee Complete 4 Views Left Result Date: 12/23/2023 CLINICAL DATA:  knee pain EXAM: LEFT KNEE - COMPLETE 4+ VIEW COMPARISON:  X-ray left knee 12/03/2023 FINDINGS: No evidence of fracture, dislocation, or joint effusion. Similar-appearing osteo necrosis of the distal femoral metadiaphysis. No evidence of arthropathy or aggressive appearing focal bone abnormality. Soft tissues are unremarkable. Vascular calcifications. IMPRESSION: No acute displaced fracture or dislocation. Electronically Signed   By: Morgane  Naveau M.D.   On: 12/23/2023 22:17   DG Hip Unilat With Pelvis 2-3 Views Left Result Date: 12/23/2023 CLINICAL DATA:  left hip pain EXAM: DG HIP (WITH OR WITHOUT PELVIS) 2-3V LEFT COMPARISON:  X-ray pelvis 11/10/2022 FINDINGS: Chronic left femoral head avascular necrosis. Severe degenerative changes left hip with loss of the femoroacetabular joint space and subchondral cystic changes as well as sclerosis. At least mild degenerative changes of the right hip with avascular necrosis of the femoral head. No acute displaced fracture or dislocation of either hips. No acute displaced fracture or diastasis of the bones of the  pelvis. IMPRESSION: 1. Chronic left femoral head avascular necrosis with severe degenerative changes of the left hip. 2. At least mild degenerative changes of the right hip with associated femoral head avascular necrosis. Electronically Signed   By: Morgane  Naveau M.D.   On: 12/23/2023 22:16   DG Chest 2 View Result Date: 12/23/2023 CLINICAL DATA:  chest pain EXAM: CHEST - 2 VIEW COMPARISON:  Chest x-ray 07/26/2022  FINDINGS: The heart and mediastinal contours are unchanged. No focal consolidation. No pulmonary edema. No pleural effusion. No pneumothorax. No acute osseous abnormality. Degenerative changes of the acromioclavicular joints. IMPRESSION: No active cardiopulmonary disease. Electronically Signed   By: Morgane  Naveau M.D.   On: 12/23/2023 22:14   DG Knee Complete 4 Views Left Result Date: 12/03/2023 CLINICAL DATA:  left knee pain EXAM: LEFT KNEE - COMPLETE 4+ VIEW COMPARISON:  11/10/2022 FINDINGS: No evidence of fracture, dislocation, or joint effusion. Stable serpiginous sclerosis in the distal femoral metaphysis without associated aggressive features, favored to reflect osteonecrosis. Mild medial femorotibial compartment joint space narrowing. Soft tissues are unremarkable. IMPRESSION: 1. No acute osseous abnormality. 2. Mild medial femorotibial compartment joint space narrowing. 3. Stable serpiginous sclerosis in the distal femoral metaphysis without associated aggressive features, favored to reflect osteonecrosis. Electronically Signed   By: Harrietta Sherry M.D.   On: 12/03/2023 10:46    EKG: EKG Interpretation Date/Time:  Thursday December 23 2023 21:41:35 EDT Ventricular Rate:  81 PR Interval:  104 QRS Duration:  84 QT Interval:  420 QTC Calculation: 487 R Axis:   66  Text Interpretation: Sinus rhythm with short PR with Premature supraventricular complexes T wave abnormality, consider inferolateral ischemia Confirmed by Nettie, Jefte Carithers ([id number]) on 12/23/2023 11:02:31  PM  Radiology: [facility]A Knee Complete 4 Views Left Result Date: 12/23/2023 CLINICAL DATA:  knee pain EXAM: LEFT KNEE - COMPLETE 4+ VIEW COMPARISON:  X-ray left knee 12/03/2023 FINDINGS: No evidence of fracture, dislocation, or joint effusion. Similar-appearing osteo necrosis of the distal femoral metadiaphysis. No evidence of arthropathy or aggressive appearing focal bone abnormality. Soft tissues are unremarkable. Vascular calcifications. IMPRESSION: No acute displaced fracture or dislocation. Electronically Signed   By: Morgane  Naveau M.D.   On: 12/23/2023 22:17   DG Hip Unilat With Pelvis 2-3 Views Left Result Date: 12/23/2023 CLINICAL DATA:  left hip pain EXAM: DG HIP (WITH OR WITHOUT PELVIS) 2-3V LEFT COMPARISON:  X-ray pelvis 11/10/2022 FINDINGS: Chronic left femoral head avascular necrosis. Severe degenerative changes left hip with loss of the femoroacetabular joint space and subchondral cystic changes as well as sclerosis. At least mild degenerative changes of the right hip with avascular necrosis of the femoral head. No acute displaced fracture or dislocation of either hips. No acute displaced fracture or diastasis of the bones of the pelvis. IMPRESSION: 1. Chronic left femoral head avascular necrosis with severe degenerative changes of the left hip. 2. At least mild degenerative changes of the right hip with associated femoral head avascular necrosis. Electronically Signed   By: Morgane  Naveau M.D.   On: 12/23/2023 22:16   DG Chest 2 View Result Date: 12/23/2023 CLINICAL DATA:  chest pain EXAM: CHEST - 2 VIEW COMPARISON:  Chest x-ray 07/26/2022 FINDINGS: The heart and mediastinal contours are unchanged. No focal consolidation. No pulmonary edema. No pleural effusion. No pneumothorax. No acute osseous abnormality. Degenerative changes of the acromioclavicular joints. IMPRESSION: No active cardiopulmonary disease. Electronically Signed   By: Morgane  Naveau M.D.   On: 12/23/2023 22:14     .Critical  Care  Performed by: Nettie Earing, MD Authorized by: Nettie Earing, MD   Critical care provider statement:    Critical care time (minutes):  30   Critical care end time:  12/24/2023 2:06 AM   Critical care was necessary to treat or prevent imminent or life-threatening deterioration of the following conditions:  Cardiac failure   Critical care was time spent personally by me on the following activities:  Development  of treatment plan with patient or surrogate, discussions with consultants, evaluation of patient's response to treatment, examination of patient, ordering and review of laboratory studies, ordering and review of radiographic studies, ordering and performing treatments and interventions, pulse oximetry, re-evaluation of patient's condition and review of old charts   I assumed direction of critical care for this patient from another provider in my specialty: no     Care discussed with: admitting provider   Comments:     Case d/w Cardiology fellow, please admit to medicine     Medications Ordered in the ED  heparin  ADULT infusion 100 units/mL (25000 units/250mL) (1,000 Units/hr Intravenous New Bag/Given 12/24/23 0031)  aspirin  chewable tablet 324 mg (324 mg Oral Given 12/23/23 2313)  ondansetron  (ZOFRAN ) injection 4 mg (4 mg Intravenous Given 12/23/23 2316)  heparin  bolus via infusion 4,000 Units (4,000 Units Intravenous Bolus from Bag 12/24/23 0025)                                    Medical Decision Making Chest pain with SOB and diaphoresis   Amount and/or Complexity of Data Reviewed Independent Historian: EMS    Details: See above  External Data Reviewed: notes.    Details: Previous notes reviewed  Labs: ordered.    Details: White count slight elevated 11, normal hemoglobin 14.8, normal platelets. Sodium slight low 133,  normal potassium 3.9, normal creatinine. Markedly elevated troponin 341  Radiology: ordered and independent interpretation performed.    Details: Normal  CXR ECG/medicine tests: ordered and independent interpretation performed. Decision-making details documented in ED Course.  Risk OTC drugs. Prescription drug management. Decision regarding hospitalization.     Final diagnoses:  Unstable angina (HCC)  Avascular necrosis of bone of right hip (HCC)   The patient appears reasonably stabilized for admission considering the current resources, flow, and capabilities available in the ED at this time, and I doubt any other [facility] requiring further screening and/or treatment in the ED prior to admission.      Sterling Mondo, MD 12/24/23 [phone]

## 2023-12-25 LAB — CBC
HCT: 38.4 % — ABNORMAL LOW (ref 39.0–52.0)
Hemoglobin: 13 g/dL (ref 13.0–17.0)
MCH: 31.9 pg (ref 26.0–34.0)
MCHC: 33.9 g/dL (ref 30.0–36.0)
MCV: 94.3 fL (ref 80.0–100.0)
Platelets: 225 K/uL (ref 150–400)
RBC: 4.07 MIL/uL — ABNORMAL LOW (ref 4.22–5.81)
RDW: 12.9 % (ref 11.5–15.5)
WBC: 11.8 K/uL — ABNORMAL HIGH (ref 4.0–10.5)
nRBC: 0 % (ref 0.0–0.2)

## 2023-12-25 LAB — BASIC METABOLIC PANEL WITH GFR
Anion gap: 11 (ref 5–15)
BUN: 17 mg/dL (ref 8–23)
CO2: 22 mmol/L (ref 22–32)
Calcium: 8.7 mg/dL — ABNORMAL LOW (ref 8.9–10.3)
Chloride: 101 mmol/L (ref 98–111)
Creatinine, Ser: 1.21 mg/dL (ref 0.61–1.24)
GFR, Estimated: >60 mL/min (ref >60)
Glucose, Bld: 177 mg/dL — ABNORMAL HIGH (ref 70–99)
Potassium: 4 mmol/L (ref 3.5–5.1)
Sodium: 134 mmol/L — ABNORMAL LOW (ref 135–145)

## 2023-12-25 LAB — GLUCOSE, CAPILLARY
Glucose-Capillary: 96 mg/dL (ref 70–99)
Glucose-Capillary: 143 mg/dL — ABNORMAL HIGH (ref 70–99)
Glucose-Capillary: 76 mg/dL (ref 70–99)
Glucose-Capillary: 205 mg/dL — ABNORMAL HIGH (ref 70–99)

## 2023-12-25 LAB — LIPOPROTEIN A (LPA): Lipoprotein (a): 143.6 nmol/L — ABNORMAL HIGH (ref <75.0)

## 2023-12-25 NOTE — Progress Notes (Signed)
 Rounding Note   Patient Name: Carlos Charles Date of Encounter: 12/25/2023  [facility] Cardiologist: None   Subjective Last episode of chest pain was Thursday, no chest pain in the last 24 hours.  Scheduled Meds:  aspirin  EC  81 mg Oral Daily   atorvastatin   80 mg Oral Daily   clopidogrel   75 mg Oral Daily   empagliflozin   10 mg Oral Daily   folic acid   1 mg Oral Daily   heparin  injection (subcutaneous)  5,000 Units Subcutaneous Q8H   insulin  aspart  0-15 Units Subcutaneous TID WC   losartan   25 mg Oral Daily   multivitamin with minerals  1 tablet Oral Daily   propranolol   20 mg Oral TID   thiamine   100 mg Oral Daily   Or   thiamine   100 mg Intravenous Daily   Continuous Infusions:  PRN Meds: acetaminophen , LORazepam  **OR** LORazepam , nitroGLYCERIN , ondansetron  (ZOFRAN ) IV   Vital Signs  Vitals:   12/24/23 2335 12/25/23 0445 12/25/23 0452 12/25/23 0745  BP: 131/71 119/78  118/71  Pulse: (!) 58 61  70  Resp: 18 20  16   Temp: 98.5 F (36.9 C) 98.9 F (37.2 C)  98.6 F (37 C)  TempSrc: Oral Oral  Oral  SpO2: 95% 97%  100%  Weight:   73.6 kg   Height:        Intake/Output Summary (Last 24 hours) at 12/25/2023 0935 Last data filed at 12/25/2023 0400 Gross per 24 hour  Intake 680 ml  Output 1175 ml  Net -495 ml      12/25/2023    4:52 AM 12/24/2023    1:17 AM 12/23/2023    9:29 PM  Last 3 Weights  Weight (lbs) 162 lb 4.1 oz 160 lb 11.5 oz 184 lb  Weight (kg) 73.6 kg 72.9 kg 83.462 kg      Telemetry Normal sinus rhythm, no significant ventricular ectopy- Personally Reviewed  ECG  Normal sinus rhythm, mild ST of related change in the anterior leads with T wave inversion - Personally Reviewed  Physical Exam  GEN: No acute distress.   Neck: No JVD Cardiac: RRR, no murmurs, rubs, or gallops.  Respiratory: Clear to auscultation bilaterally. GI: Soft, nontender, non-distended  MS: No edema; No deformity. Neuro:  Nonfocal  Psych: Normal affect    Labs High Sensitivity Troponin:   Recent Labs  Lab 12/23/23 2131 12/23/23 2357  TROPONINIHS 341* 320*     Chemistry Recent Labs  Lab 12/23/23 2131 12/23/23 2357 12/24/23 0258 12/25/23 0819  NA 133*  --  134* 134*  K 3.9  --  3.8 4.0  CL 98  --  101 101  CO2 20*  --  23 22  GLUCOSE 247*  --  134* 177*  BUN 13  --  13 17  CREATININE 1.25*  --  1.06 1.21  CALCIUM  9.2  --  8.9 8.7*  PROT  --  7.2 7.2  --   ALBUMIN  --  3.3* 3.3*  --   AST  --  18 16  --   ALT  --  14 13  --   ALKPHOS  --  72 71  --   BILITOT  --  0.4 0.6  --   GFRNONAA >60  --  >60 >60  ANIONGAP 15  --  10 11    Lipids  Recent Labs  Lab 12/24/23 0258  CHOL 183  TRIG 69  HDL 57  LDLCALC 112*  CHOLHDL  3.2    Hematology Recent Labs  Lab 12/23/23 2131 12/24/23 0258 12/25/23 0231  WBC 11.0* 10.0 11.8*  RBC 4.62 4.49 4.07*  HGB 14.8 14.2 13.0  HCT 43.6 41.8 38.4*  MCV 94.4 93.1 94.3  MCH 32.0 31.6 31.9  MCHC 33.9 34.0 33.9  RDW 12.6 12.6 12.9  PLT 253 248 225   Thyroid  Recent Labs  Lab 12/24/23 0258  TSH 1.958    BNPNo results for input(s): BNP, PROBNP in the last 168 hours.  DDimer No results for input(s): DDIMER in the last 168 hours.   Radiology  VAS US  ABI WITH/WO TBI Result Date: 12/24/2023  LOWER EXTREMITY DOPPLER STUDY Patient Name:  Carlos Charles  Date of Exam:   12/24/2023 Medical Rec #: [id number]      Accession #:    [id number] Date of Birth: 08/11/1960       Patient Gender: M Patient Age:   63 years Exam Location:  [facility] Procedure:      VAS US  ABI WITH/WO TBI Referring Phys: GORDY BERGAMO --------------------------------------------------------------------------------  Indications: Peripheral artery disease. High Risk Factors: Hypertension, hyperlipidemia, Diabetes, current smoker.  Performing Technologist: Jimmye Scarce RVT  Examination Guidelines: A complete evaluation includes at minimum, Doppler waveform signals and systolic blood pressure reading at the level  of bilateral brachial, anterior tibial, and posterior tibial arteries, when vessel segments are accessible. Bilateral testing is considered an integral part of a complete examination. Photoelectric Plethysmograph (PPG) waveforms and toe systolic pressure readings are included as required and additional duplex testing as needed. Limited examinations for reoccurring indications may be performed as noted.  ABI Findings: +---------+------------------+-----+--------+--------+ Right    Rt Pressure (mmHg)IndexWaveformComment  +---------+------------------+-----+--------+--------+ Brachial                                IV       +---------+------------------+-----+--------+--------+ PTA      121               1.23 biphasic         +---------+------------------+-----+--------+--------+ DP       140               1.43 biphasic         +---------+------------------+-----+--------+--------+ Great Toe69                0.70 Normal           +---------+------------------+-----+--------+--------+ +---------+------------------+-----+---------+-------+ Left     Lt Pressure (mmHg)IndexWaveform Comment +---------+------------------+-----+---------+-------+ Brachial 98                     triphasic        +---------+------------------+-----+---------+-------+ PTA      96                0.98 biphasic         +---------+------------------+-----+---------+-------+ DP       96                0.98 biphasic         +---------+------------------+-----+---------+-------+ Great Toe59                0.60 Abnormal         +---------+------------------+-----+---------+-------+ +-------+-----------+-----------+------------+------------+ ABI/TBIToday's ABIToday's TBIPrevious ABIPrevious TBI +-------+-----------+-----------+------------+------------+ Right  1.43       0.70                                +-------+-----------+-----------+------------+------------+  Left   0.98        0.60                                +-------+-----------+-----------+------------+------------+  Summary: Right: The right toe-brachial index is normal.  Resting right ankle brachial index is falsely elevated. Left: Resting left ankle-brachial index is within normal range. The left toe-brachial index is abnormal.  *See table(s) above for measurements and observations.     Preliminary    ECHOCARDIOGRAM COMPLETE Result Date: 12/24/2023    ECHOCARDIOGRAM REPORT   Patient Name:   Carlos Charles Date of Exam: 12/24/2023 Medical Rec #:  [id number]     Height:       71.0 in Accession #:    [id number]    Weight:       160.7 lb Date of Birth:  10-Nov-1960      BSA:          1.921 m Patient Age:    63 years      BP:           123/72 mmHg Patient Gender: M             HR:           78 bpm. Exam Location:  Inpatient Procedure: 2D Echo, Cardiac Doppler, Color Doppler and Intracardiac            Opacification Agent (Both Spectral and Color Flow Doppler were            utilized during procedure). Indications:    R07.9* Chest pain, unspecified  History:        Patient has no prior history of Echocardiogram examinations.                 Risk Factors:Hypertension, Diabetes, Dyslipidemia and                 Polysubstance Abuse.  Sonographer:    Damien Senior RDCS Referring Phys: [phone] [facility]  Sonographer Comments: Technically difficult due to poor apical window and constant patient movement. IMPRESSIONS  1. Left ventricular ejection fraction, by estimation, is 55 to 60%. The left ventricle has normal function. The left ventricle has no regional wall motion abnormalities. Left ventricular diastolic parameters were normal.  2. Right ventricular systolic function is normal. The right ventricular size is normal. There is normal pulmonary artery systolic pressure.  3. The mitral valve is normal in structure. Trivial mitral valve regurgitation. No evidence of mitral stenosis.  4. The aortic valve is tricuspid. Aortic valve  regurgitation is not visualized. No aortic stenosis is present.  5. The inferior vena cava is normal in size with greater than 50% respiratory variability, suggesting right atrial pressure of 3 mmHg. Comparison(s): No prior Echocardiogram. FINDINGS  Left Ventricle: Left ventricular ejection fraction, by estimation, is 55 to 60%. The left ventricle has normal function. The left ventricle has no regional wall motion abnormalities. The left ventricular internal cavity size was normal in size. There is  no left ventricular hypertrophy. Left ventricular diastolic parameters were normal. Right Ventricle: The right ventricular size is normal. No increase in right ventricular wall thickness. Right ventricular systolic function is normal. There is normal pulmonary artery systolic pressure. The tricuspid regurgitant velocity is 1.76 m/s, and  with an assumed right atrial pressure of 3 mmHg, the estimated right ventricular systolic pressure is 15.4 mmHg. Left Atrium: Left atrial size was normal in size. Right  Atrium: Right atrial size was normal in size. Pericardium: There is no evidence of pericardial effusion. Mitral Valve: The mitral valve is normal in structure. Trivial mitral valve regurgitation. No evidence of mitral valve stenosis. Tricuspid Valve: The tricuspid valve is normal in structure. Tricuspid valve regurgitation is mild . No evidence of tricuspid stenosis. Aortic Valve: The aortic valve is tricuspid. Aortic valve regurgitation is not visualized. No aortic stenosis is present. Aortic valve mean gradient measures 5.0 mmHg. Aortic valve peak gradient measures 9.6 mmHg. Aortic valve area, by VTI measures 2.19 cm. Pulmonic Valve: The pulmonic valve was normal in structure. Pulmonic valve regurgitation is not visualized. No evidence of pulmonic stenosis. Aorta: The aortic root and ascending aorta are structurally normal, with no evidence of dilitation. Venous: The inferior vena cava is normal in size with greater  than 50% respiratory variability, suggesting right atrial pressure of 3 mmHg. IAS/Shunts: The atrial septum is grossly normal.  LEFT VENTRICLE PLAX 2D LVIDd:         5.20 cm LVIDs:         4.60 cm LV PW:         1.30 cm LV IVS:        0.80 cm LVOT diam:     2.20 cm LV SV:         53 LV SV Index:   28 LVOT Area:     3.80 cm  RIGHT VENTRICLE RV S prime:     13.50 cm/s TAPSE (M-mode): 1.8 cm LEFT ATRIUM             Index        RIGHT ATRIUM           Index LA diam:        3.50 cm 1.82 cm/m   RA Area:     16.20 cm LA Vol (A2C):   47.7 ml 24.83 ml/m  RA Volume:   46.40 ml  24.15 ml/m LA Vol (A4C):   71.8 ml 37.37 ml/m LA Biplane Vol: 59.2 ml 30.81 ml/m  AORTIC VALVE AV Area (Vmax):    2.10 cm AV Area (Vmean):   2.33 cm AV Area (VTI):     2.19 cm AV Vmax:           155.00 cm/s AV Vmean:          102.000 cm/s AV VTI:            0.243 m AV Peak Grad:      9.6 mmHg AV Mean Grad:      5.0 mmHg LVOT Vmax:         85.50 cm/s LVOT Vmean:        62.600 cm/s LVOT VTI:          0.140 m LVOT/AV VTI ratio: 0.58  AORTA Ao Root diam: 3.00 cm Ao Asc diam:  3.20 cm TRICUSPID VALVE TR Peak grad:   12.4 mmHg TR Vmax:        176.00 cm/s  SHUNTS Systemic VTI:  0.14 m Systemic Diam: 2.20 cm Sunit Tolia Electronically signed by Madonna Large Signature Date/Time: 12/24/2023/11:21:36 AM    Final    DG Knee Complete 4 Views Left Result Date: 12/23/2023 CLINICAL DATA:  knee pain EXAM: LEFT KNEE - COMPLETE 4+ VIEW COMPARISON:  X-ray left knee 12/03/2023 FINDINGS: No evidence of fracture, dislocation, or joint effusion. Similar-appearing osteo necrosis of the distal femoral metadiaphysis. No evidence of arthropathy or aggressive appearing focal bone abnormality. Soft tissues are  unremarkable. Vascular calcifications. IMPRESSION: No acute displaced fracture or dislocation. Electronically Signed   By: Morgane  Naveau M.D.   On: 12/23/2023 22:17   DG Hip Unilat With Pelvis 2-3 Views Left Result Date: 12/23/2023 CLINICAL DATA:  left hip pain  EXAM: DG HIP (WITH OR WITHOUT PELVIS) 2-3V LEFT COMPARISON:  X-ray pelvis 11/10/2022 FINDINGS: Chronic left femoral head avascular necrosis. Severe degenerative changes left hip with loss of the femoroacetabular joint space and subchondral cystic changes as well as sclerosis. At least mild degenerative changes of the right hip with avascular necrosis of the femoral head. No acute displaced fracture or dislocation of either hips. No acute displaced fracture or diastasis of the bones of the pelvis. IMPRESSION: 1. Chronic left femoral head avascular necrosis with severe degenerative changes of the left hip. 2. At least mild degenerative changes of the right hip with associated femoral head avascular necrosis. Electronically Signed   By: Morgane  Naveau M.D.   On: 12/23/2023 22:16   DG Chest 2 View Result Date: 12/23/2023 CLINICAL DATA:  chest pain EXAM: CHEST - 2 VIEW COMPARISON:  Chest x-ray 07/26/2022 FINDINGS: The heart and mediastinal contours are unchanged. No focal consolidation. No pulmonary edema. No pleural effusion. No pneumothorax. No acute osseous abnormality. Degenerative changes of the acromioclavicular joints. IMPRESSION: No active cardiopulmonary disease. Electronically Signed   By: Morgane  Naveau M.D.   On: 12/23/2023 22:14    Cardiac Studies  Echo 12/24/2023 1. Left ventricular ejection fraction, by estimation, is 55 to 60%. The  left ventricle has normal function. The left ventricle has no regional  wall motion abnormalities. Left ventricular diastolic parameters were  normal.   2. Right ventricular systolic function is normal. The right ventricular  size is normal. There is normal pulmonary artery systolic pressure.   3. The mitral valve is normal in structure. Trivial mitral valve  regurgitation. No evidence of mitral stenosis.   4. The aortic valve is tricuspid. Aortic valve regurgitation is not  visualized. No aortic stenosis is present.   5. The inferior vena cava is normal in  size with greater than 50%  respiratory variability, suggesting right atrial pressure of 3 mmHg.   Comparison(s): No prior Echocardiogram.   Patient Profile   63 y.o. male with PMH of HTN, HLD, DM II, cocaine use and noncompliance who presented with NSTEMI. Hip xray showed chronic left femoral avascular necrosis with severe degenerative changes of left hip  Assessment & Plan   NSTEMI  - seen by Dr. Ladona, felt to be poor candidate for intervention given history of noncompliance and active cocaine use.  EKG does show mild ST elevation in the anterior leads.  Last episode of chest pain was Thursday, no chest pain in the last 24 hours.  - Continue aspirin , Plavix  and atorvastatin .  No plan for intervention unless significant chest discomfort.  Paged by nurse regarding ST elevation on this morning's EKG, same ST elevation was present on yesterday's EKG. patient was completely chest pain-free.   Left femoral avascular necrosis: seen on x ray, per primary team  Hypertension: Continue propranolol  and losartan   Hyperlipidemia: On atorvastatin   DM II: SSI    For questions or updates, please contact [facility] Please consult [email or web address] for contact info under     Signed, Scot Ford, PA  12/25/2023, 9:35 AM

## 2023-12-25 NOTE — Plan of Care (Signed)
   Problem: Education: Goal: Knowledge of General Education information will improve Description Including pain rating scale, medication(s)/side effects and non-pharmacologic comfort measures Outcome: Progressing

## 2023-12-25 NOTE — Plan of Care (Signed)
  Problem: Education: Goal: Knowledge of General Education information will improve Description: Including pain rating scale, medication(s)/side effects and non-pharmacologic comfort measures Outcome: Progressing   Problem: Health Behavior/Discharge Planning: Goal: Ability to manage health-related needs will improve Outcome: Progressing   Problem: Clinical Measurements: Goal: Ability to maintain clinical measurements within normal limits will improve Outcome: Progressing Goal: Will remain free from infection Outcome: Progressing Goal: Diagnostic test results will improve Outcome: Progressing Goal: Respiratory complications will improve Outcome: Progressing Goal: Cardiovascular complication will be avoided Outcome: Progressing   Problem: Activity: Goal: Risk for activity intolerance will decrease Outcome: Progressing   Problem: Nutrition: Goal: Adequate nutrition will be maintained Outcome: Progressing   Problem: Coping: Goal: Level of anxiety will decrease Outcome: Progressing   Problem: Elimination: Goal: Will not experience complications related to bowel motility Outcome: Progressing Goal: Will not experience complications related to urinary retention Outcome: Progressing   Problem: Pain Managment: Goal: General experience of comfort will improve and/or be controlled Outcome: Progressing   Problem: Safety: Goal: Ability to remain free from injury will improve Outcome: Progressing   Problem: Skin Integrity: Goal: Risk for impaired skin integrity will decrease Outcome: Progressing   Problem: Education: Goal: Ability to describe self-care measures that may prevent or decrease complications (Diabetes Survival Skills Education) will improve Outcome: Progressing Goal: Individualized Educational Video(s) Outcome: Progressing   Problem: Coping: Goal: Ability to adjust to condition or change in health will improve Outcome: Progressing   Problem: Fluid  Volume: Goal: Ability to maintain a balanced intake and output will improve Outcome: Progressing   Problem: Health Behavior/Discharge Planning: Goal: Ability to identify and utilize available resources and services will improve Outcome: Progressing Goal: Ability to manage health-related needs will improve Outcome: Progressing   Problem: Metabolic: Goal: Ability to maintain appropriate glucose levels will improve Outcome: Progressing   Problem: Nutritional: Goal: Maintenance of adequate nutrition will improve Outcome: Progressing Goal: Progress toward achieving an optimal weight will improve Outcome: Progressing   Problem: Skin Integrity: Goal: Risk for impaired skin integrity will decrease Outcome: Progressing   Problem: Tissue Perfusion: Goal: Adequacy of tissue perfusion will improve Outcome: Progressing   Problem: Education: Goal: Understanding of cardiac disease, CV risk reduction, and recovery process will improve Outcome: Progressing Goal: Individualized Educational Video(s) Outcome: Progressing   Problem: Activity: Goal: Ability to tolerate increased activity will improve Outcome: Progressing   Problem: Cardiac: Goal: Ability to achieve and maintain adequate cardiovascular perfusion will improve Outcome: Progressing   Problem: Health Behavior/Discharge Planning: Goal: Ability to safely manage health-related needs after discharge will improve Outcome: Progressing

## 2023-12-25 NOTE — Progress Notes (Signed)
 PROGRESS NOTE    Carlos Charles  FMW:[id number] DOB: 02/21/1961 DOA: 12/23/2023 PCP: Patient, No Pcp Per   Brief Narrative:  63 year old male with past medical history of hypertension, diabetes, polysubstance use comes in for evaluation of chest pain.  Abnormal  EKG with mild ST elevation and elevated troponins were noted.  Patient admitted for further management/evaluation.  Assessment & Plan:   Principal Problem:   NSTEMI (non-ST elevated myocardial infarction) (HCC) Active Problems:   Type 2 diabetes mellitus (HCC)   Hypertension   Alcohol abuse   Cocaine use   Peripheral artery disease (HCC)   Assessment: This is a 63 year old African-American male with past medical history of essential hypertension and diabetes who comes in with chest pain, bilateral hip pain and  shortness of breath.  Found to have abnormal EKG with elevated troponin levels.  He admits to cocaine use within the last 1 week for pain issues.  He also smokes cigarettes on a daily basis and consumes alcohol quite heavily on a daily basis.   Plan: #1 NSTEMI: Multiple risk factors are present.  Troponin levels elevated but flat trend is noted.  EKG with mild STchanges but according to cardiology no significant ST elevation is noted.    - Patient evaluated by cardiology.  Cardiology is concerned about proceeding with cath and possible angioplasty given his ongoing substance use, noncompliance situation.  Medical management advised. -Patient does not report of any chest pain for 2 days. - Continue statin, aspirin  and Plavix . -Echocardiogram showed LVEF of 45% with global hypokinesis.  Started on losartan , Inderal , Jardiance .  # Diminished pulses both lower extremities: ABI/arterial duplex pending   2.  Diabetes mellitus type 2: Hemoglobin A1c 6.7.  However blood sugars were more than 300 on the day of admission.  Started on lantus  and sliding scale insulin .  #3 Essential hypertension/hyponatremia/mildly elevated  creatinine: Monitor daily BMP.  4.  Cocaine use: Urine drug screen.  Counseling provided.   5.  Alcohol use: Drinks at least 5 beers on a daily basis with liquor use over the weekend.  CIWA protocol.  TOC to see patient   6.  Nicotine  abuse: Counseled.  Nicotine  patch will be ordered if needed.   7.  Arthritis involving both hips with avascular necrosis noted on imaging studies.  Outpatient management.  As needed analgesics.     DVT Prophylaxis: IV heparin  Code Status: Full code Family Communication: Discussed with patient Disposition: Home when improved Consults called: Cardiology Admission Status: Inpatient for NSTEMI    Consultants:   Procedures:   Antimicrobials:    Subjective: Patient seen and examined at the bedside.  He denies any chest pain whatsoever.  Reports of bilateral leg pain especially on the left side.  He does have known avascular necrosis on both sides.  He states he was seen by hip specialist in the past.  EKG with ST/t changes today   Objective: Vitals:   12/25/23 0445 12/25/23 0452 12/25/23 0745 12/25/23 1200  BP: 119/78  118/71 117/75  Pulse: 61  70 61  Resp: 20  16 17   Temp: 98.9 F (37.2 C)  98.6 F (37 C) 98.5 F (36.9 C)  TempSrc: Oral  Oral Oral  SpO2: 97%  100% 96%  Weight:  73.6 kg    Height:        Intake/Output Summary (Last 24 hours) at 12/25/2023 1512 Last data filed at 12/25/2023 1200 Gross per 24 hour  Intake 740 ml  Output 1595 ml  Net -855  ml   Filed Weights   12/23/23 2129 12/24/23 0117 12/25/23 0452  Weight: 83.5 kg 72.9 kg 73.6 kg    Examination:  General exam: Appears calm and comfortable  Respiratory system: Bilateral decreased breath sounds at bases Cardiovascular system: S1 & S2 heard, Rate controlled Gastrointestinal system: Abdomen is nondistended, soft and nontender. Normal bowel sounds heard. Extremities: No cyanosis, clubbing, edema  Central nervous system: Alert and oriented. No focal neurological  deficits. Moving extremities Skin: No rashes, lesions or ulcers Psychiatry: Judgement and insight appear normal. Mood & affect appropriate.     Data Reviewed: I have personally reviewed following labs and imaging studies  CBC: Recent Labs  Lab 12/23/23 2131 12/24/23 0258 12/25/23 0231  WBC 11.0* 10.0 11.8*  HGB 14.8 14.2 13.0  HCT 43.6 41.8 38.4*  MCV 94.4 93.1 94.3  PLT 253 248 225   Basic Metabolic Panel: Recent Labs  Lab 12/23/23 2131 12/24/23 0258 12/25/23 0819  NA 133* 134* 134*  K 3.9 3.8 4.0  CL 98 101 101  CO2 20* 23 22  GLUCOSE 247* 134* 177*  BUN 13 13 17   CREATININE 1.25* 1.06 1.21  CALCIUM  9.2 8.9 8.7*   GFR: Estimated Creatinine Clearance: 66.7 mL/min (by C-G formula based on SCr of 1.21 mg/dL). Liver Function Tests: Recent Labs  Lab 12/23/23 2357 12/24/23 0258  AST 18 16  ALT 14 13  ALKPHOS 72 71  BILITOT 0.4 0.6  PROT 7.2 7.2  ALBUMIN 3.3* 3.3*   No results for input(s): LIPASE, AMYLASE in the last 168 hours. No results for input(s): AMMONIA in the last 168 hours. Coagulation Profile: No results for input(s): INR, PROTIME in the last 168 hours. Cardiac Enzymes: No results for input(s): CKTOTAL, CKMB, CKMBINDEX, TROPONINI in the last 168 hours. BNP (last 3 results) No results for input(s): PROBNP in the last 8760 hours. HbA1C: Recent Labs    12/24/23 0258  HGBA1C 6.7*   CBG: Recent Labs  Lab 12/24/23 1430 12/24/23 1632 12/24/23 2109 12/25/23 0617 12/25/23 1202  GLUCAP 285* 158* 137* 96 143*   Lipid Profile: Recent Labs    12/24/23 0258  CHOL 183  HDL 57  LDLCALC 112*  TRIG 69  CHOLHDL 3.2   Thyroid Function Tests: Recent Labs    12/24/23 0258  TSH 1.958   Anemia Panel: No results for input(s): VITAMINB12, FOLATE, FERRITIN, TIBC, IRON, RETICCTPCT in the last 72 hours. Sepsis Labs: No results for input(s): PROCALCITON, LATICACIDVEN in the last 168 hours.  Recent Results (from  the past 240 hours)  Surgical pcr screen     Status: None   Collection Time: 12/24/23  1:26 AM   Specimen: Nasal Mucosa; Nasal Swab  Result Value Ref Range Status   MRSA, PCR NEGATIVE NEGATIVE Final   Staphylococcus aureus NEGATIVE NEGATIVE Final    Comment: (NOTE) The Xpert SA Assay (FDA approved for NASAL specimens in patients 49 years of age and older), is one component of a comprehensive surveillance program. It is not intended to diagnose infection nor to guide or monitor treatment. Performed at [facility], [address] [place], [place] [address]          Radiology Studies: VAS US  ABI WITH/WO TBI Result Date: 12/24/2023  LOWER EXTREMITY DOPPLER STUDY Patient Name:  Broox Lonigro  Date of Exam:   12/24/2023 Medical Rec #: [id number]      Accession #:    [id number] Date of Birth: [birth year]       Patient  Gender: M Patient Age:   66 years [facility] Procedure:      VAS US  ABI WITH/WO TBI Referring Phys: GORDY BERGAMO --------------------------------------------------------------------------------  Indications: Peripheral artery disease. High Risk Factors: Hypertension, hyperlipidemia, Diabetes, current smoker.  Performing Technologist: Jimmye Scarce RVT  Examination Guidelines: A complete evaluation includes at minimum, Doppler waveform signals and systolic blood pressure reading at the level of bilateral brachial, anterior tibial, and posterior tibial arteries, when vessel segments are accessible. Bilateral testing is considered an integral part of a complete examination. Photoelectric Plethysmograph (PPG) waveforms and toe systolic pressure readings are included as required and additional duplex testing as needed. Limited examinations for reoccurring indications may be performed as noted.  ABI Findings: +---------+------------------+-----+--------+--------+ Right    Rt Pressure (mmHg)IndexWaveformComment   +---------+------------------+-----+--------+--------+ Brachial                                IV       +---------+------------------+-----+--------+--------+ PTA      121               1.23 biphasic         +---------+------------------+-----+--------+--------+ DP       140               1.43 biphasic         +---------+------------------+-----+--------+--------+ Great Toe69                0.70 Normal           +---------+------------------+-----+--------+--------+ +---------+------------------+-----+---------+-------+ Left     Lt Pressure (mmHg)IndexWaveform Comment +---------+------------------+-----+---------+-------+ Brachial 98                     triphasic        +---------+------------------+-----+---------+-------+ PTA      96                0.98 biphasic         +---------+------------------+-----+---------+-------+ DP       96                0.98 biphasic         +---------+------------------+-----+---------+-------+ Great Toe59                0.60 Abnormal         +---------+------------------+-----+---------+-------+ +-------+-----------+-----------+------------+------------+ ABI/TBIToday's ABIToday's TBIPrevious ABIPrevious TBI +-------+-----------+-----------+------------+------------+ Right  1.43       0.70                                +-------+-----------+-----------+------------+------------+ Left   0.98       0.60                                +-------+-----------+-----------+------------+------------+  Summary: Right: The right toe-brachial index is normal.  Resting right ankle brachial index is falsely elevated. Left: Resting left ankle-brachial index is within normal range. The left toe-brachial index is abnormal.  *See table(s) above for measurements and observations.     Preliminary    ECHOCARDIOGRAM COMPLETE Result Date: 12/24/2023    ECHOCARDIOGRAM REPORT   Patient Name:   Carlos Charles Date of Exam: 12/24/2023  Medical Rec #:  [id number]     Height:       71.0 in Accession #:    [id number]  Weight:       160.7 lb Date of Birth:  Dec 23, 1960      BSA:          1.921 m Patient Age:    63 years      BP:           123/72 mmHg Patient Gender: M             HR:           78 bpm. Exam Location:  Inpatient Procedure: 2D Echo, Cardiac Doppler, Color Doppler and Intracardiac            Opacification Agent (Both Spectral and Color Flow Doppler were            utilized during procedure). Indications:    R07.9* Chest pain, unspecified  History:        Patient has no prior history of Echocardiogram examinations.                 Risk Factors:Hypertension, Diabetes, Dyslipidemia and                 Polysubstance Abuse.  Sonographer:    Damien Senior RDCS Referring Phys: [phone] [facility]  Sonographer Comments: Technically difficult due to poor apical window and constant patient movement. IMPRESSIONS  1. Left ventricular ejection fraction, by estimation, is 55 to 60%. The left ventricle has normal function. The left ventricle has no regional wall motion abnormalities. Left ventricular diastolic parameters were normal.  2. Right ventricular systolic function is normal. The right ventricular size is normal. There is normal pulmonary artery systolic pressure.  3. The mitral valve is normal in structure. Trivial mitral valve regurgitation. No evidence of mitral stenosis.  4. The aortic valve is tricuspid. Aortic valve regurgitation is not visualized. No aortic stenosis is present.  5. The inferior vena cava is normal in size with greater than 50% respiratory variability, suggesting right atrial pressure of 3 mmHg. Comparison(s): No prior Echocardiogram. FINDINGS  Left Ventricle: Left ventricular ejection fraction, by estimation, is 55 to 60%. The left ventricle has normal function. The left ventricle has no regional wall motion abnormalities. The left ventricular internal cavity size was normal in size. There is  no left ventricular  hypertrophy. Left ventricular diastolic parameters were normal. Right Ventricle: The right ventricular size is normal. No increase in right ventricular wall thickness. Right ventricular systolic function is normal. There is normal pulmonary artery systolic pressure. The tricuspid regurgitant velocity is 1.76 m/s, and  with an assumed right atrial pressure of 3 mmHg, the estimated right ventricular systolic pressure is 15.4 mmHg. Left Atrium: Left atrial size was normal in size. Right Atrium: Right atrial size was normal in size. Pericardium: There is no evidence of pericardial effusion. Mitral Valve: The mitral valve is normal in structure. Trivial mitral valve regurgitation. No evidence of mitral valve stenosis. Tricuspid Valve: The tricuspid valve is normal in structure. Tricuspid valve regurgitation is mild . No evidence of tricuspid stenosis. Aortic Valve: The aortic valve is tricuspid. Aortic valve regurgitation is not visualized. No aortic stenosis is present. Aortic valve mean gradient measures 5.0 mmHg. Aortic valve peak gradient measures 9.6 mmHg. Aortic valve area, by VTI measures 2.19 cm. Pulmonic Valve: The pulmonic valve was normal in structure. Pulmonic valve regurgitation is not visualized. No evidence of pulmonic stenosis. Aorta: The aortic root and ascending aorta are structurally normal, with no evidence of dilitation. Venous: The inferior vena cava is normal in size with greater than  50% respiratory variability, suggesting right atrial pressure of 3 mmHg. IAS/Shunts: The atrial septum is grossly normal.  LEFT VENTRICLE PLAX 2D LVIDd:         5.20 cm LVIDs:         4.60 cm LV PW:         1.30 cm LV IVS:        0.80 cm LVOT diam:     2.20 cm LV SV:         53 LV SV Index:   28 LVOT Area:     3.80 cm  RIGHT VENTRICLE RV S prime:     13.50 cm/s TAPSE (M-mode): 1.8 cm LEFT ATRIUM             Index        RIGHT ATRIUM           Index LA diam:        3.50 cm 1.82 cm/m   RA Area:     16.20 cm LA Vol  (A2C):   47.7 ml 24.83 ml/m  RA Volume:   46.40 ml  24.15 ml/m LA Vol (A4C):   71.8 ml 37.37 ml/m LA Biplane Vol: 59.2 ml 30.81 ml/m  AORTIC VALVE AV Area (Vmax):    2.10 cm AV Area (Vmean):   2.33 cm AV Area (VTI):     2.19 cm AV Vmax:           155.00 cm/s AV Vmean:          102.000 cm/s AV VTI:            0.243 m AV Peak Grad:      9.6 mmHg AV Mean Grad:      5.0 mmHg LVOT Vmax:         85.50 cm/s LVOT Vmean:        62.600 cm/s LVOT VTI:          0.140 m LVOT/AV VTI ratio: 0.58  AORTA Ao Root diam: 3.00 cm Ao Asc diam:  3.20 cm TRICUSPID VALVE TR Peak grad:   12.4 mmHg TR Vmax:        176.00 cm/s  SHUNTS Systemic VTI:  0.14 m Systemic Diam: 2.20 cm Sunit Tolia Electronically signed by Madonna Large Signature Date/Time: 12/24/2023/11:21:36 AM    Final    DG Knee Complete 4 Views Left Result Date: 12/23/2023 CLINICAL DATA:  knee pain EXAM: LEFT KNEE - COMPLETE 4+ VIEW COMPARISON:  X-ray left knee 12/03/2023 FINDINGS: No evidence of fracture, dislocation, or joint effusion. Similar-appearing osteo necrosis of the distal femoral metadiaphysis. No evidence of arthropathy or aggressive appearing focal bone abnormality. Soft tissues are unremarkable. Vascular calcifications. IMPRESSION: No acute displaced fracture or dislocation. Electronically Signed   By: Morgane  Naveau M.D.   On: 12/23/2023 22:17   DG Hip Unilat With Pelvis 2-3 Views Left Result Date: 12/23/2023 CLINICAL DATA:  left hip pain EXAM: DG HIP (WITH OR WITHOUT PELVIS) 2-3V LEFT COMPARISON:  X-ray pelvis 11/10/2022 FINDINGS: Chronic left femoral head avascular necrosis. Severe degenerative changes left hip with loss of the femoroacetabular joint space and subchondral cystic changes as well as sclerosis. At least mild degenerative changes of the right hip with avascular necrosis of the femoral head. No acute displaced fracture or dislocation of either hips. No acute displaced fracture or diastasis of the bones of the pelvis. IMPRESSION: 1. Chronic  left femoral head avascular necrosis with severe degenerative changes of the left hip. 2. At least mild  degenerative changes of the right hip with associated femoral head avascular necrosis. Electronically Signed   By: Morgane  Naveau M.D.   On: 12/23/2023 22:16   DG Chest 2 View Result Date: 12/23/2023 CLINICAL DATA:  chest pain EXAM: CHEST - 2 VIEW COMPARISON:  Chest x-ray 07/26/2022 FINDINGS: The heart and mediastinal contours are unchanged. No focal consolidation. No pulmonary edema. No pleural effusion. No pneumothorax. No acute osseous abnormality. Degenerative changes of the acromioclavicular joints. IMPRESSION: No active cardiopulmonary disease. Electronically Signed   By: Morgane  Naveau M.D.   On: 12/23/2023 22:14        Scheduled Meds:  aspirin  EC  81 mg Oral Daily   atorvastatin   80 mg Oral Daily   clopidogrel   75 mg Oral Daily   empagliflozin   10 mg Oral Daily   folic acid   1 mg Oral Daily   heparin  injection (subcutaneous)  5,000 Units Subcutaneous Q8H   insulin  aspart  0-15 Units Subcutaneous TID WC   losartan   25 mg Oral Daily   multivitamin with minerals  1 tablet Oral Daily   propranolol   20 mg Oral TID   thiamine   100 mg Oral Daily   Or   thiamine   100 mg Intravenous Daily   Continuous Infusions:          Tra Wilemon, MD [facility] 12/25/2023, 3:12 PM

## 2023-12-26 LAB — GLUCOSE, CAPILLARY
Glucose-Capillary: 105 mg/dL — ABNORMAL HIGH (ref 70–99)
Glucose-Capillary: 132 mg/dL — ABNORMAL HIGH (ref 70–99)

## 2023-12-26 LAB — VAS US ABI WITH/WO TBI
Left ABI: 0.98
Right ABI: 1.43

## 2023-12-26 MED ORDER — LOSARTAN POTASSIUM 25 MG PO TABS: 25.0000 mg | ORAL_TABLET | Freq: Every day | ORAL | 3 refills | Status: AC

## 2023-12-26 MED ORDER — EMPAGLIFLOZIN 10 MG PO TABS: 10.0000 mg | ORAL_TABLET | Freq: Every day | ORAL | 11 refills | Status: AC

## 2023-12-26 MED ORDER — ASPIRIN 81 MG PO TBEC
81.0000 mg | DELAYED_RELEASE_TABLET | Freq: Every day | ORAL | Status: AC
Start: 2023-12-27 — End: ?

## 2023-12-26 MED ORDER — CLOPIDOGREL BISULFATE 75 MG PO TABS: 75.0000 mg | ORAL_TABLET | Freq: Every day | ORAL | 3 refills | Status: AC

## 2023-12-26 MED ORDER — PROPRANOLOL HCL 20 MG PO TABS: 20.0000 mg | ORAL_TABLET | Freq: Three times a day (TID) | ORAL | 5 refills | Status: AC

## 2023-12-26 MED ORDER — ATORVASTATIN CALCIUM 80 MG PO TABS: 80.0000 mg | ORAL_TABLET | Freq: Every day | ORAL | 3 refills | Status: AC

## 2023-12-26 MED ORDER — NITROGLYCERIN 0.4 MG SL SUBL: 0.4000 mg | SUBLINGUAL_TABLET | SUBLINGUAL | 3 refills | Status: AC | PRN

## 2023-12-26 NOTE — Progress Notes (Signed)
 Mobility Specialist Progress Note:    12/26/23 1337  Mobility  Activity Refused and notified nurse if applicable   Pt declined mobility, no reason specified. Left with all needs met. RN notified.   Carlos Charles Mobility Specialist Please contact via Special educational needs teacher or  Rehab office at [phone]

## 2023-12-26 NOTE — Progress Notes (Signed)
 Patient is eager to go home. He has completed 48 hours of IV heparin . Last episode of chest pain was on Thursday, no chest pain in the past 72 hours. I discussed his case with Dr. Inocencio, he is ok to be discharged. Close outpatient follow up scheduled with me. I emphasized with the patient multiple times the importance of stopping cocaine. Rx sent to his pharmacy.

## 2023-12-26 NOTE — Progress Notes (Signed)
 Mobility Specialist Progress Note:    12/26/23 1416  Mobility  Activity Ambulated independently  Level of Assistance Independent  Assistive Device None  Distance Ambulated (ft) 1000 ft  Activity Response Tolerated well  Mobility Referral Yes  Mobility visit 1 Mobility  Mobility Specialist Start Time (ACUTE ONLY) 1400  Mobility Specialist Stop Time (ACUTE ONLY) 1416  Mobility Specialist Time Calculation (min) (ACUTE ONLY) 16 min   Pt received ambulating in hallway, pt agreeable to mobility session off unit. Ambulated 1058ft, no AD required. Tolerated well, asx throughout. Eager for d/c. Returned to room, all needs met.   Carlos Charles Mobility Specialist Please contact via Special educational needs teacher or  Rehab office at [phone]

## 2023-12-26 NOTE — Discharge Summary (Signed)
 Physician Discharge Summary  Mcarthur Ivins FMW:[id number] DOB: [birth year] DOA: 12/23/2023  PCP: Patient, No Pcp Per  Admit date: 12/23/2023 Discharge date: 12/26/2023  Admitted From: Home Disposition: Home  Recommendations for Outpatient Follow-up:  Follow up with PCP in 1 week  Follow up in ED if symptoms reappear   Home Health: No Equipment/Devices: None  Discharge Condition: Stable CODE STATUS: Full Diet recommendation: Heart healthy  Brief/Interim Summary: Assessment: This is a 63 year old African-American male with past medical history of essential hypertension and diabetes who comes in with chest pain, bilateral hip pain and  shortness of breath.  Found to have abnormal EKG with elevated troponin levels.  He admits to cocaine use within the last 1 week for pain issues.  He also smokes cigarettes on a daily basis and consumes alcohol quite heavily on a daily basis.  Patient admitted due to concern for non-STEMI.     Plan: #1 NSTEMI:  Troponin levels elevated but flat trend is noted.  EKG with mild STchanges but according to cardiology no significant ST elevation is noted.     - Patient evaluated by cardiology.  Cardiology is concerned about proceeding with cath and possible angioplasty given his ongoing substance use, noncompliance situation.  Medical management advised. -Patient remained chest free in the hospital -Initiated on statin, aspirin  and Plavix .  Continued on discharge. -Echocardiogram showed LVEF of 45% with global hypokinesis.  Started on losartan , Inderal , Jardiance .  Continued on discharge.  Appreciate cardiology recommendations   # Diminished pulses both lower extremities:  ABI Right: The right toe-brachial index is normal.   Resting right ankle brachial index is falsely elevated.  Left: Resting left ankle-brachial index is within normal range. The left  toe-brachial index is abnormal.    2.  Diabetes mellitus type 2: Hemoglobin A1c 6.7.  However blood sugars  were more than 300 on the day of admission.  Started on lantus  and sliding scale insulin . Patient used to be on insulin /metformin  in the past but was noncompliant. Recommend continuing metformin , Jardiance  added for heart failure.  Need to follow-up with PCP.   #3 Essential hypertension/hyponatremia/mildly elevated creatinine: Monitor daily BMP.   4.  Cocaine use: Urine drug screen.  Counseling provided.  Patient declined assistance for substance abuse.   5.  Alcohol use: Drinks at least 5 beers on a daily basis with liquor use over the weekend.  CIWA protocol.  TOC to see patient   6.  Nicotine  abuse: Counseled.   7.  Arthritis involving both hips with avascular necrosis noted on imaging studies.  Outpatient management.  Patient had seen orthopedic surgery in the past.  Recommend continued follow-up.  As needed analgesics.     Discharge Diagnoses:  Principal Problem:   NSTEMI (non-ST elevated myocardial infarction) (HCC) Active Problems:   Type 2 diabetes mellitus (HCC)   Hypertension   Alcohol abuse   Cocaine use   Peripheral artery disease [facility])    Discharge Instructions  Discharge Instructions     Call MD for:  difficulty breathing, headache or visual disturbances   Complete by: As directed    Call MD for:  extreme fatigue   Complete by: As directed    Call MD for:  persistant dizziness or light-headedness   Complete by: As directed    Diet - low sodium heart healthy   Complete by: As directed    Discharge instructions   Complete by: As directed    1.  Continue heart medications as prescribed. 2.  Establish care  with primary care provider within a week.  Follow-up with cardiology have been set up. 3.  We strongly recommend to stay away from alcohol and illicit drugs. 4.  Return to the ER if recurrence of chest pain, shortness of breath   Increase activity slowly   Complete by: As directed       Allergies as of 12/26/2023   No Known Allergies      Medication List      STOP taking these medications    lisinopril  10 MG tablet Commonly known as: ZESTRIL        TAKE these medications    aspirin  EC 81 MG tablet Take 1 tablet (81 mg total) by mouth daily. Swallow whole. Start taking on: December 27, 2023   atorvastatin  80 MG tablet Commonly known as: LIPITOR Take 1 tablet (80 mg total) by mouth daily. Start taking on: December 27, 2023   clopidogrel  75 MG tablet Commonly known as: PLAVIX  Take 1 tablet (75 mg total) by mouth daily. Start taking on: December 27, 2023   empagliflozin  10 MG Tabs tablet Commonly known as: JARDIANCE  Take 1 tablet (10 mg total) by mouth daily. Start taking on: December 27, 2023   losartan  25 MG tablet Commonly known as: COZAAR  Take 1 tablet (25 mg total) by mouth daily. Start taking on: December 27, 2023   meloxicam  15 MG tablet Commonly known as: Mobic  Take 1 tablet (15 mg total) by mouth daily.   metFORMIN  1000 MG tablet Commonly known as: GLUCOPHAGE  Take 1 tablet (1,000 mg total) by mouth 2 (two) times daily with a meal.   nitroGLYCERIN  0.4 MG SL tablet Commonly known as: NITROSTAT  Place 1 tablet (0.4 mg total) under the tongue every 5 (five) minutes x 3 doses as needed for chest pain.   propranolol  20 MG tablet Commonly known as: INDERAL  Take 1 tablet (20 mg total) by mouth 3 (three) times daily.        Follow-up Information     Janene Boer, [place] Follow up on 01/10/2024.   Specialties: Cardiology, Radiology Why: @8 :50AM. Cardiology follow up Contact information: [address] [place] [address] [phone]                No Known Allergies  Consultations:    Procedures/Studies: VAS US  ABI WITH/WO TBI Result Date: 12/24/2023  LOWER EXTREMITY DOPPLER STUDY Patient Name:  Quinntin Malter  Date of Exam:   12/24/2023 Medical Rec #: [id number]      Accession #:    [id number] Date of Birth: July 23, 1960       Patient Gender: M Patient Age:   63 years Exam Location:  [facility] Procedure:      VAS US  ABI WITH/WO TBI Referring Phys: GORDY BERGAMO --------------------------------------------------------------------------------  Indications: Peripheral artery disease. High Risk Factors: Hypertension, hyperlipidemia, Diabetes, current smoker.  Performing Technologist: Jimmye Scarce RVT  Examination Guidelines: A complete evaluation includes at minimum, Doppler waveform signals and systolic blood pressure reading at the level of bilateral brachial, anterior tibial, and posterior tibial arteries, when vessel segments are accessible. Bilateral testing is considered an integral part of a complete examination. Photoelectric Plethysmograph (PPG) waveforms and toe systolic pressure readings are included as required and additional duplex testing as needed. Limited examinations for reoccurring indications may be performed as noted.  ABI Findings: +---------+------------------+-----+--------+--------+ Right    Rt Pressure (mmHg)IndexWaveformComment  +---------+------------------+-----+--------+--------+ Brachial  IV       +---------+------------------+-----+--------+--------+ PTA      121               1.23 biphasic         +---------+------------------+-----+--------+--------+ DP       140               1.43 biphasic         +---------+------------------+-----+--------+--------+ Great Toe69                0.70 Normal           +---------+------------------+-----+--------+--------+ +---------+------------------+-----+---------+-------+ Left     Lt Pressure (mmHg)IndexWaveform Comment +---------+------------------+-----+---------+-------+ Brachial 98                     triphasic        +---------+------------------+-----+---------+-------+ PTA      96                0.98 biphasic         +---------+------------------+-----+---------+-------+ DP       96                0.98 biphasic          +---------+------------------+-----+---------+-------+ Great Toe59                0.60 Abnormal         +---------+------------------+-----+---------+-------+ +-------+-----------+-----------+------------+------------+ ABI/TBIToday's ABIToday's TBIPrevious ABIPrevious TBI +-------+-----------+-----------+------------+------------+ Right  1.43       0.70                                +-------+-----------+-----------+------------+------------+ Left   0.98       0.60                                +-------+-----------+-----------+------------+------------+  Summary: Right: The right toe-brachial index is normal.  Resting right ankle brachial index is falsely elevated. Left: Resting left ankle-brachial index is within normal range. The left toe-brachial index is abnormal.  *See table(s) above for measurements and observations.     Preliminary    ECHOCARDIOGRAM COMPLETE Result Date: 12/24/2023    ECHOCARDIOGRAM REPORT   Patient Name:   LAMOUNT BANKSON Date of Exam: 12/24/2023 Medical Rec #:  [id number]     Height:       71.0 in Accession #:    [id number]    Weight:       160.7 lb Date of Birth:  1961-03-10      BSA:          1.921 m Patient Age:    63 years      BP:           123/72 mmHg Patient Gender: M             HR:           78 bpm. Exam Location:  Inpatient Procedure: 2D Echo, Cardiac Doppler, Color Doppler and Intracardiac            Opacification Agent (Both Spectral and Color Flow Doppler were            utilized during procedure). Indications:    R07.9* Chest pain, unspecified  History:        Patient has no prior history of Echocardiogram examinations.  Risk Factors:Hypertension, Diabetes, Dyslipidemia and                 Polysubstance Abuse.  Sonographer:    Damien Senior RDCS Referring Phys: [phone] [facility]  Sonographer Comments: Technically difficult due to poor apical window and constant patient movement. IMPRESSIONS  1. Left ventricular ejection fraction, by  estimation, is 55 to 60%. The left ventricle has normal function. The left ventricle has no regional wall motion abnormalities. Left ventricular diastolic parameters were normal.  2. Right ventricular systolic function is normal. The right ventricular size is normal. There is normal pulmonary artery systolic pressure.  3. The mitral valve is normal in structure. Trivial mitral valve regurgitation. No evidence of mitral stenosis.  4. The aortic valve is tricuspid. Aortic valve regurgitation is not visualized. No aortic stenosis is present.  5. The inferior vena cava is normal in size with greater than 50% respiratory variability, suggesting right atrial pressure of 3 mmHg. Comparison(s): No prior Echocardiogram. FINDINGS  Left Ventricle: Left ventricular ejection fraction, by estimation, is 55 to 60%. The left ventricle has normal function. The left ventricle has no regional wall motion abnormalities. The left ventricular internal cavity size was normal in size. There is  no left ventricular hypertrophy. Left ventricular diastolic parameters were normal. Right Ventricle: The right ventricular size is normal. No increase in right ventricular wall thickness. Right ventricular systolic function is normal. There is normal pulmonary artery systolic pressure. The tricuspid regurgitant velocity is 1.76 m/s, and  with an assumed right atrial pressure of 3 mmHg, the estimated right ventricular systolic pressure is 15.4 mmHg. Left Atrium: Left atrial size was normal in size. Right Atrium: Right atrial size was normal in size. Pericardium: There is no evidence of pericardial effusion. Mitral Valve: The mitral valve is normal in structure. Trivial mitral valve regurgitation. No evidence of mitral valve stenosis. Tricuspid Valve: The tricuspid valve is normal in structure. Tricuspid valve regurgitation is mild . No evidence of tricuspid stenosis. Aortic Valve: The aortic valve is tricuspid. Aortic valve regurgitation is not  visualized. No aortic stenosis is present. Aortic valve mean gradient measures 5.0 mmHg. Aortic valve peak gradient measures 9.6 mmHg. Aortic valve area, by VTI measures 2.19 cm. Pulmonic Valve: The pulmonic valve was normal in structure. Pulmonic valve regurgitation is not visualized. No evidence of pulmonic stenosis. Aorta: The aortic root and ascending aorta are structurally normal, with no evidence of dilitation. Venous: The inferior vena cava is normal in size with greater than 50% respiratory variability, suggesting right atrial pressure of 3 mmHg. IAS/Shunts: The atrial septum is grossly normal.  LEFT VENTRICLE PLAX 2D LVIDd:         5.20 cm LVIDs:         4.60 cm LV PW:         1.30 cm LV IVS:        0.80 cm LVOT diam:     2.20 cm LV SV:         53 LV SV Index:   28 LVOT Area:     3.80 cm  RIGHT VENTRICLE RV S prime:     13.50 cm/s TAPSE (M-mode): 1.8 cm LEFT ATRIUM             Index        RIGHT ATRIUM           Index LA diam:        3.50 cm 1.82 cm/m   RA Area:  16.20 cm LA Vol (A2C):   47.7 ml 24.83 ml/m  RA Volume:   46.40 ml  24.15 ml/m LA Vol (A4C):   71.8 ml 37.37 ml/m LA Biplane Vol: 59.2 ml 30.81 ml/m  AORTIC VALVE AV Area (Vmax):    2.10 cm AV Area (Vmean):   2.33 cm AV Area (VTI):     2.19 cm AV Vmax:           155.00 cm/s AV Vmean:          102.000 cm/s AV VTI:            0.243 m AV Peak Grad:      9.6 mmHg AV Mean Grad:      5.0 mmHg LVOT Vmax:         85.50 cm/s LVOT Vmean:        62.600 cm/s LVOT VTI:          0.140 m LVOT/AV VTI ratio: 0.58  AORTA Ao Root diam: 3.00 cm Ao Asc diam:  3.20 cm TRICUSPID VALVE TR Peak grad:   12.4 mmHg TR Vmax:        176.00 cm/s  SHUNTS Systemic VTI:  0.14 m Systemic Diam: 2.20 cm Sunit Tolia Electronically signed by Madonna Large Signature Date/Time: 12/24/2023/11:21:36 AM    Final    DG Knee Complete 4 Views Left Result Date: 12/23/2023 CLINICAL DATA:  knee pain EXAM: LEFT KNEE - COMPLETE 4+ VIEW COMPARISON:  X-ray left knee 12/03/2023 FINDINGS: No  evidence of fracture, dislocation, or joint effusion. Similar-appearing osteo necrosis of the distal femoral metadiaphysis. No evidence of arthropathy or aggressive appearing focal bone abnormality. Soft tissues are unremarkable. Vascular calcifications. IMPRESSION: No acute displaced fracture or dislocation. Electronically Signed   By: Morgane  Naveau M.D.   On: 12/23/2023 22:17   DG Hip Unilat With Pelvis 2-3 Views Left Result Date: 12/23/2023 CLINICAL DATA:  left hip pain EXAM: DG HIP (WITH OR WITHOUT PELVIS) 2-3V LEFT COMPARISON:  X-ray pelvis 11/10/2022 FINDINGS: Chronic left femoral head avascular necrosis. Severe degenerative changes left hip with loss of the femoroacetabular joint space and subchondral cystic changes as well as sclerosis. At least mild degenerative changes of the right hip with avascular necrosis of the femoral head. No acute displaced fracture or dislocation of either hips. No acute displaced fracture or diastasis of the bones of the pelvis. IMPRESSION: 1. Chronic left femoral head avascular necrosis with severe degenerative changes of the left hip. 2. At least mild degenerative changes of the right hip with associated femoral head avascular necrosis. Electronically Signed   By: Morgane  Naveau M.D.   On: 12/23/2023 22:16   DG Chest 2 View Result Date: 12/23/2023 CLINICAL DATA:  chest pain EXAM: CHEST - 2 VIEW COMPARISON:  Chest x-ray 07/26/2022 FINDINGS: The heart and mediastinal contours are unchanged. No focal consolidation. No pulmonary edema. No pleural effusion. No pneumothorax. No acute osseous abnormality. Degenerative changes of the acromioclavicular joints. IMPRESSION: No active cardiopulmonary disease. Electronically Signed   By: Morgane  Naveau M.D.   On: 12/23/2023 22:14   DG Knee Complete 4 Views Left Result Date: 12/03/2023 CLINICAL DATA:  left knee pain EXAM: LEFT KNEE - COMPLETE 4+ VIEW COMPARISON:  11/10/2022 FINDINGS: No evidence of fracture, dislocation, or joint  effusion. Stable serpiginous sclerosis in the distal femoral metaphysis without associated aggressive features, favored to reflect osteonecrosis. Mild medial femorotibial compartment joint space narrowing. Soft tissues are unremarkable. IMPRESSION: 1. No acute osseous abnormality. 2. Mild medial femorotibial compartment  joint space narrowing. 3. Stable serpiginous sclerosis in the distal femoral metaphysis without associated aggressive features, favored to reflect osteonecrosis. Electronically Signed   By: Harrietta Sherry M.D.   On: 12/03/2023 10:46      Subjective:   Discharge Exam: Vitals:   12/26/23 0711 12/26/23 1127  BP:  (!) 142/77  Pulse:  61  Resp:    Temp: 98.9 F (37.2 C) 98 F (36.7 C)  SpO2:  95%    General: Pt is alert, awake, not in acute distress Cardiovascular: rate controlled, S1/S2 + Respiratory: bilateral decreased breath sounds at bases Abdominal: Soft, NT, ND, bowel sounds + Extremities: no edema, no cyanosis    The results of significant diagnostics from this hospitalization (including imaging, microbiology, ancillary and laboratory) are listed below for reference.     Microbiology: Recent Results (from the past 240 hours)  Surgical pcr screen     Status: None   Collection Time: 12/24/23  1:26 AM   Specimen: Nasal Mucosa; Nasal Swab  Result Value Ref Range Status   MRSA, PCR NEGATIVE NEGATIVE Final   Staphylococcus aureus NEGATIVE NEGATIVE Final    Comment: (NOTE) The Xpert SA Assay (FDA approved for NASAL specimens in patients 94 years of age and older), is one component of a comprehensive surveillance program. It is not intended to diagnose infection nor to guide or monitor treatment. Performed at [facility], [address], [place], [place] [address]      Labs: BNP (last 3 results) No results for input(s): BNP in the last 8760 hours. Basic Metabolic Panel: Recent Labs  Lab 12/23/23 2131 12/24/23 0258 12/25/23 0819  NA 133*  134* 134*  K 3.9 3.8 4.0  CL 98 101 101  CO2 20* 23 22  GLUCOSE 247* 134* 177*  BUN 13 13 17   CREATININE 1.25* 1.06 1.21  CALCIUM  9.2 8.9 8.7*   Liver Function Tests: Recent Labs  Lab 12/23/23 2357 12/24/23 0258  AST 18 16  ALT 14 13  ALKPHOS 72 71  BILITOT 0.4 0.6  PROT 7.2 7.2  ALBUMIN 3.3* 3.3*   No results for input(s): LIPASE, AMYLASE in the last 168 hours. No results for input(s): AMMONIA in the last 168 hours. CBC: Recent Labs  Lab 12/23/23 2131 12/24/23 0258 12/25/23 0231  WBC 11.0* 10.0 11.8*  HGB 14.8 14.2 13.0  HCT 43.6 41.8 38.4*  MCV 94.4 93.1 94.3  PLT 253 248 225   Cardiac Enzymes: No results for input(s): CKTOTAL, CKMB, CKMBINDEX, TROPONINI in the last 168 hours. BNP: Invalid input(s): POCBNP CBG: Recent Labs  Lab 12/25/23 1202 12/25/23 1640 12/25/23 2048 12/26/23 0607 12/26/23 1132  GLUCAP 143* 76 205* 105* 132*   D-Dimer No results for input(s): DDIMER in the last 72 hours. Hgb A1c Recent Labs    12/24/23 0258  HGBA1C 6.7*   Lipid Profile Recent Labs    12/24/23 0258  CHOL 183  HDL 57  LDLCALC 112*  TRIG 69  CHOLHDL 3.2   Thyroid function studies Recent Labs    12/24/23 0258  TSH 1.958   Anemia work up No results for input(s): VITAMINB12, FOLATE, FERRITIN, TIBC, IRON, RETICCTPCT in the last 72 hours. Urinalysis    Component Value Date/Time   COLORURINE YELLOW 07/26/2022 1722   APPEARANCEUR CLEAR 07/26/2022 1722   LABSPEC 1.015 07/26/2022 1722   PHURINE 5.0 07/26/2022 1722   GLUCOSEU 50 (A) 07/26/2022 1722   HGBUR NEGATIVE 07/26/2022 1722   BILIRUBINUR NEGATIVE 07/26/2022 1722  KETONESUR NEGATIVE 07/26/2022 1722   PROTEINUR NEGATIVE 07/26/2022 1722   UROBILINOGEN 0.2 03/01/2019 1410   NITRITE NEGATIVE 07/26/2022 1722   LEUKOCYTESUR NEGATIVE 07/26/2022 1722   Sepsis Labs Recent Labs  Lab 12/23/23 2131 12/24/23 0258 12/25/23 0231  WBC 11.0* 10.0 11.8*   Microbiology Recent  Results (from the past 240 hours)  Surgical pcr screen     Status: None   Collection Time: 12/24/23  1:26 AM   Specimen: Nasal Mucosa; Nasal Swab  Result Value Ref Range Status   MRSA, PCR NEGATIVE NEGATIVE Final   Staphylococcus aureus NEGATIVE NEGATIVE Final    Comment: (NOTE) The Xpert SA Assay (FDA approved for NASAL specimens in patients 42 years of age and older), is one component of a comprehensive surveillance program. It is not intended to diagnose infection nor to guide or monitor treatment. Performed at [facility], [address] [place], [place] [address]      Time coordinating discharge: 35 minutes  SIGNED:   Derryl Duval, MD  [facility] 12/26/2023, 4:37 PM

## 2023-12-26 NOTE — Plan of Care (Signed)
 ?  Problem: Clinical Measurements: ?Goal: Will remain free from infection ?Outcome: Progressing ?  ?

## 2023-12-26 NOTE — Progress Notes (Signed)
 PROGRESS NOTE    Carlos Charles  FMW:[id number] DOB: [birth year] DOA: 12/23/2023 PCP: Patient, No Pcp Per   Brief Narrative:  63 year old male with past medical history of hypertension, diabetes, polysubstance use comes in for evaluation of chest pain.  Abnormal  EKG with mild ST elevation and elevated troponins were noted.  Patient admitted for further management/evaluation.  Assessment & Plan:   Principal Problem:   NSTEMI (non-ST elevated myocardial infarction) (HCC) Active Problems:   Type 2 diabetes mellitus (HCC)   Hypertension   Alcohol abuse   Cocaine use   Peripheral artery disease (HCC)   Assessment: This is a 63 year old African-American male with past medical history of essential hypertension and diabetes who comes in with chest pain, bilateral hip pain and  shortness of breath.  Found to have abnormal EKG with elevated troponin levels.  He admits to cocaine use within the last 1 week for pain issues.  He also smokes cigarettes on a daily basis and consumes alcohol quite heavily on a daily basis.    Plan: #1 NSTEMI: Multiple risk factors are present.  Troponin levels elevated but flat trend is noted.  EKG with mild STchanges but according to cardiology no significant ST elevation is noted.    - Patient evaluated by cardiology.  Cardiology is concerned about proceeding with cath and possible angioplasty given his ongoing substance use, noncompliance situation.  Medical management advised. -Patient does not report of any chest pain since admission - Continue statin, aspirin  and Plavix . -Echocardiogram showed LVEF of 45% with global hypokinesis.  Started on losartan , Inderal , Jardiance .  # Diminished pulses both lower extremities:  ABI Right: The right toe-brachial index is normal.   Resting right ankle brachial index is falsely elevated.  Left: Resting left ankle-brachial index is within normal range. The left  toe-brachial index is abnormal.    2.  Diabetes mellitus type  2: Hemoglobin A1c 6.7.  However blood sugars were more than 300 on the day of admission.  Started on lantus  and sliding scale insulin . Patient used to be on insulin /metformin  in the past but was noncompliant. Will restart metformin  on discharge.  #3 Essential hypertension/hyponatremia/mildly elevated creatinine: Monitor daily BMP.  4.  Cocaine use: Urine drug screen.  Counseling provided.   5.  Alcohol use: Drinks at least 5 beers on a daily basis with liquor use over the weekend.  CIWA protocol.  TOC to see patient   6.  Nicotine  abuse: Counseled.  Nicotine  patch will be ordered if needed.   7.  Arthritis involving both hips with avascular necrosis noted on imaging studies.  Outpatient management.  Patient had seen orthopedic surgery in the past.  Recommend continued follow-up.  As needed analgesics.  Physical therapy.     DVT Prophylaxis: IV heparin  Code Status: Full code Family Communication: Discussed with patient Disposition: Home when improved Consults called: Cardiology Admission Status: Currently inpatient. ?  Discharge   Consultants:   Procedures:   Antimicrobials:    Subjective: Patient seen and examined at the bedside.  He denies any chest pain whatsoever.  Reports of left hip pain but was able to ambulate.  Vital signs are stable.  Objective: Vitals:   12/25/23 2149 12/26/23 0029 12/26/23 0307 12/26/23 0711  BP: (!) 148/82 118/68 117/70   Pulse: 63 64 60   Resp:      Temp:  99.1 F (37.3 C) 98.4 F (36.9 C) (P) 98.9 F (37.2 C)  TempSrc:  Oral Oral (P) Oral  SpO2: 97% 93%  95%   Weight:      Height:        Intake/Output Summary (Last 24 hours) at 12/26/2023 1019 Last data filed at 12/26/2023 0306 Gross per 24 hour  Intake 540 ml  Output 1595 ml  Net -1055 ml   Filed Weights   12/23/23 2129 12/24/23 0117 12/25/23 0452  Weight: 83.5 kg 72.9 kg 73.6 kg    Examination:  General exam: Appears calm and comfortable  Respiratory system: Bilateral  decreased breath sounds at bases Cardiovascular system: S1 & S2 heard, Rate controlled Gastrointestinal system: Abdomen is nondistended, soft and nontender. Normal bowel sounds heard. Extremities: No cyanosis, clubbing, edema  Central nervous system: Alert and oriented. No focal neurological deficits. Moving extremities Skin: No rashes, lesions or ulcers Psychiatry: Judgement and insight appear normal. Mood & affect appropriate.     Data Reviewed: I have personally reviewed following labs and imaging studies  CBC: Recent Labs  Lab 12/23/23 2131 12/24/23 0258 12/25/23 0231  WBC 11.0* 10.0 11.8*  HGB 14.8 14.2 13.0  HCT 43.6 41.8 38.4*  MCV 94.4 93.1 94.3  PLT 253 248 225   Basic Metabolic Panel: Recent Labs  Lab 12/23/23 2131 12/24/23 0258 12/25/23 0819  NA 133* 134* 134*  K 3.9 3.8 4.0  CL 98 101 101  CO2 20* 23 22  GLUCOSE 247* 134* 177*  BUN 13 13 17   CREATININE 1.25* 1.06 1.21  CALCIUM  9.2 8.9 8.7*   GFR: Estimated Creatinine Clearance: 66.7 mL/min (by C-G formula based on SCr of 1.21 mg/dL). Liver Function Tests: Recent Labs  Lab 12/23/23 2357 12/24/23 0258  AST 18 16  ALT 14 13  ALKPHOS 72 71  BILITOT 0.4 0.6  PROT 7.2 7.2  ALBUMIN 3.3* 3.3*   No results for input(s): LIPASE, AMYLASE in the last 168 hours. No results for input(s): AMMONIA in the last 168 hours. Coagulation Profile: No results for input(s): INR, PROTIME in the last 168 hours. Cardiac Enzymes: No results for input(s): CKTOTAL, CKMB, CKMBINDEX, TROPONINI in the last 168 hours. BNP (last 3 results) No results for input(s): PROBNP in the last 8760 hours. HbA1C: Recent Labs    12/24/23 0258  HGBA1C 6.7*   CBG: Recent Labs  Lab 12/25/23 0617 12/25/23 1202 12/25/23 1640 12/25/23 2048 12/26/23 0607  GLUCAP 96 143* 76 205* 105*   Lipid Profile: Recent Labs    12/24/23 0258  CHOL 183  HDL 57  LDLCALC 112*  TRIG 69  CHOLHDL 3.2   Thyroid Function  Tests: Recent Labs    12/24/23 0258  TSH 1.958   Anemia Panel: No results for input(s): VITAMINB12, FOLATE, FERRITIN, TIBC, IRON, RETICCTPCT in the last 72 hours. Sepsis Labs: No results for input(s): PROCALCITON, LATICACIDVEN in the last 168 hours.  Recent Results (from the past 240 hours)  Surgical pcr screen     Status: None   Collection Time: 12/24/23  1:26 AM   Specimen: Nasal Mucosa; Nasal Swab  Result Value Ref Range Status   MRSA, PCR NEGATIVE NEGATIVE Final   Staphylococcus aureus NEGATIVE NEGATIVE Final    Comment: (NOTE) The Xpert SA Assay (FDA approved for NASAL specimens in patients 50 years of age and older), is one component of a comprehensive surveillance program. It is not intended to diagnose infection nor to guide or monitor treatment. Performed at [facility], [address] [place], [place] [address]          Radiology Studies: VAS US  ABI WITH/WO TBI  Result Date: 12/24/2023  LOWER EXTREMITY DOPPLER STUDY Patient Name:  Cherokee Clowers  Date of Exam:   12/24/2023 Medical Rec #: [id number]      Accession #:    [id number] Date of Birth: 04-Feb-1961       Patient Gender: M Patient Age:   71 years Exam Location:  [facility] Procedure:      VAS US  ABI WITH/WO TBI Referring Phys: GORDY BERGAMO --------------------------------------------------------------------------------  Indications: Peripheral artery disease. High Risk Factors: Hypertension, hyperlipidemia, Diabetes, current smoker.  Performing Technologist: Jimmye Scarce RVT  Examination Guidelines: A complete evaluation includes at minimum, Doppler waveform signals and systolic blood pressure reading at the level of bilateral brachial, anterior tibial, and posterior tibial arteries, when vessel segments are accessible. Bilateral testing is considered an integral part of a complete examination. Photoelectric Plethysmograph (PPG) waveforms and toe systolic pressure readings are included as  required and additional duplex testing as needed. Limited examinations for reoccurring indications may be performed as noted.  ABI Findings: +---------+------------------+-----+--------+--------+ Right    Rt Pressure (mmHg)IndexWaveformComment  +---------+------------------+-----+--------+--------+ Brachial                                IV       +---------+------------------+-----+--------+--------+ PTA      121               1.23 biphasic         +---------+------------------+-----+--------+--------+ DP       140               1.43 biphasic         +---------+------------------+-----+--------+--------+ Great Toe69                0.70 Normal           +---------+------------------+-----+--------+--------+ +---------+------------------+-----+---------+-------+ Left     Lt Pressure (mmHg)IndexWaveform Comment +---------+------------------+-----+---------+-------+ Brachial 98                     triphasic        +---------+------------------+-----+---------+-------+ PTA      96                0.98 biphasic         +---------+------------------+-----+---------+-------+ DP       96                0.98 biphasic         +---------+------------------+-----+---------+-------+ Great Toe59                0.60 Abnormal         +---------+------------------+-----+---------+-------+ +-------+-----------+-----------+------------+------------+ ABI/TBIToday's ABIToday's TBIPrevious ABIPrevious TBI +-------+-----------+-----------+------------+------------+ Right  1.43       0.70                                +-------+-----------+-----------+------------+------------+ Left   0.98       0.60                                +-------+-----------+-----------+------------+------------+  Summary: Right: The right toe-brachial index is normal.  Resting right ankle brachial index is falsely elevated. Left: Resting left ankle-brachial index is within normal range.  The left toe-brachial index is abnormal.  *See table(s) above for measurements and observations.     Preliminary  Scheduled Meds:  aspirin  EC  81 mg Oral Daily   atorvastatin   80 mg Oral Daily   clopidogrel   75 mg Oral Daily   empagliflozin   10 mg Oral Daily   folic acid   1 mg Oral Daily   heparin  injection (subcutaneous)  5,000 Units Subcutaneous Q8H   insulin  aspart  0-15 Units Subcutaneous TID WC   losartan   25 mg Oral Daily   multivitamin with minerals  1 tablet Oral Daily   propranolol   20 mg Oral TID   thiamine   100 mg Oral Daily   Or   thiamine   100 mg Intravenous Daily   Continuous Infusions:          Derryl Duval, MD [facility] 12/26/2023, 10:19 AM

## 2024-01-10 ENCOUNTER — Ambulatory Visit: Payer: MEDICAID | Attending: Physician Assistant | Admitting: Physician Assistant

## 2024-01-10 NOTE — Progress Notes (Signed)
 This encounter was created in error - please disregard.
